# Patient Record
Sex: Male | Born: 1985 | Race: Black or African American | Hispanic: No | Marital: Married | Smoking: Current every day smoker
Health system: Southern US, Community
[De-identification: ages and names within clinical notes are randomized; demographics above are authoritative.]

---

## 2010-05-09 ENCOUNTER — Inpatient Hospital Stay (INDEPENDENT_AMBULATORY_CARE_PROVIDER_SITE_OTHER)
Admission: RE | Admit: 2010-05-09 | Discharge: 2010-05-09 | Disposition: A | Discharge status: Home / Self Care | Payer: Self-pay | Source: Ambulatory Visit | Attending: Emergency Medicine | Admitting: Emergency Medicine

## 2010-05-09 DIAGNOSIS — J020 Streptococcal pharyngitis: Secondary | ICD-10-CM

## 2011-01-12 ENCOUNTER — Inpatient Hospital Stay (INDEPENDENT_AMBULATORY_CARE_PROVIDER_SITE_OTHER)
Admission: RE | Admit: 2011-01-12 | Discharge: 2011-01-12 | Disposition: A | Discharge status: Home / Self Care | Payer: Self-pay | Source: Ambulatory Visit | Attending: Family Medicine | Admitting: Family Medicine

## 2011-01-12 DIAGNOSIS — H109 Unspecified conjunctivitis: Secondary | ICD-10-CM

## 2012-08-17 ENCOUNTER — Encounter (HOSPITAL_COMMUNITY): Payer: Self-pay | Admitting: Emergency Medicine

## 2012-08-17 ENCOUNTER — Emergency Department (HOSPITAL_COMMUNITY)
Admission: EM | Admit: 2012-08-17 | Discharge: 2012-08-18 | Disposition: A | Discharge status: Home / Self Care | Payer: Self-pay | Attending: Emergency Medicine | Admitting: Emergency Medicine

## 2012-08-17 DIAGNOSIS — K921 Melena: Secondary | ICD-10-CM | POA: Insufficient documentation

## 2012-08-17 DIAGNOSIS — F17200 Nicotine dependence, unspecified, uncomplicated: Secondary | ICD-10-CM | POA: Insufficient documentation

## 2012-08-17 DIAGNOSIS — K648 Other hemorrhoids: Secondary | ICD-10-CM | POA: Insufficient documentation

## 2012-08-17 NOTE — ED Notes (Signed)
Patient here for rectal bleeding.  Patient states that he has bleeding when he has a BM.  Patient states that he works for a [employer].

## 2012-08-18 MED ORDER — HYDROCORTISONE ACETATE 25 MG RE SUPP: 25.0000 mg | Freq: Two times a day (BID) | RECTAL | Status: DC

## 2012-08-18 NOTE — ED Provider Notes (Signed)
History     CSN: [id number]  Arrival date & time 08/17/12  2210   First MD Initiated Contact with Patient 08/18/12 0011      Chief Complaint  Patient presents with  . Rectal Bleeding    (Consider location/radiation/quality/duration/timing/severity/associated sxs/prior treatment) HPI Comments: Patient had 2 BM's tonight both with blood on tissue No Hx hemorrhoids, rectal pain , trauma recent illness/diarrhea  Both does have multiple BMs daily per routine  Denies lightheadedness, abdominal pain, dysuria  Patient is a 27 y.o. male presenting with hematochezia. The history is provided by the patient.  Rectal Bleeding Quality:  Bright red Timing:  Intermittent Chronicity:  New Context: defecation   Context: not constipation, not diarrhea, not rectal injury and not rectal pain   Similar prior episodes: no   Relieved by:  None tried Ineffective treatments:  None tried Associated symptoms: no abdominal pain, no dizziness and no fever   Risk factors: NSAID use     History reviewed. No pertinent past medical history.  History reviewed. No pertinent past surgical history.  History reviewed. No pertinent family history.  History  Substance Use Topics  . Smoking status: Current Every Day Smoker    Types: Cigarettes  . Smokeless tobacco: Not on file  . Alcohol Use: No     Comment: occa.      Review of Systems  Constitutional: Negative for fever and chills.  Gastrointestinal: Positive for blood in stool and hematochezia. Negative for nausea, abdominal pain, diarrhea, constipation, anal bleeding and rectal pain.  Skin: Negative for pallor.  Neurological: Negative for dizziness and weakness.  Hematological: Does not bruise/bleed easily.  All other systems reviewed and are negative.    Allergies  Review of patient's allergies indicates no known allergies.  Home Medications   Current Outpatient Rx  Name  Route  Sig  Dispense  Refill  . ibuprofen (ADVIL,MOTRIN) 200 MG  tablet   Oral   Take 200-1,000 mg by mouth every 6 (six) hours as needed for pain.         . hydrocortisone (ANUSOL-HC) 25 MG suppository   Rectal   Place 1 suppository (25 mg total) rectally 2 (two) times daily.   14 suppository   0     BP 130/78  Pulse 71  Temp(Src) 98.6 F (37 C) (Oral)  Resp 16  SpO2 98%  Physical Exam  Nursing note and vitals reviewed. Constitutional: He is oriented to person, place, and time. He appears well-developed and well-nourished. No distress.  Morbid obesity  HENT:  Head: Normocephalic.  Eyes: Pupils are equal, round, and reactive to light.  Cardiovascular: Normal rate and regular rhythm.   Pulmonary/Chest: Effort normal.  Abdominal: Soft. Bowel sounds are normal.  Genitourinary: Prostate normal. Rectal exam shows internal hemorrhoid. Rectal exam shows no external hemorrhoid. Prostate is not enlarged and not tender.  Neurological: He is alert and oriented to person, place, and time.  Skin: Skin is warm and dry. He is not diaphoretic.    ED Course  Procedures (including critical care time)  Labs Reviewed - No data to display No results found.   1. Internal hemorrhoid, bleeding       MDM   Will treat with Anusol suppositories for 7 days and GI FU if still havin gbleeding with BM        Arman Filter, NP 08/18/12 [phone]

## 2012-08-18 NOTE — ED Provider Notes (Signed)
Medical screening examination/treatment/procedure(s) were performed by non-physician practitioner and as supervising physician I was immediately available for consultation/collaboration.  Norman Bier R. Munachimso Rigdon, MD 08/18/12 0805 

## 2013-05-15 ENCOUNTER — Emergency Department (HOSPITAL_BASED_OUTPATIENT_CLINIC_OR_DEPARTMENT_OTHER)
Admission: EM | Admit: 2013-05-15 | Discharge: 2013-05-15 | Disposition: A | Discharge status: Home / Self Care | Payer: Self-pay | Attending: Emergency Medicine | Admitting: Emergency Medicine

## 2013-05-15 ENCOUNTER — Encounter (HOSPITAL_BASED_OUTPATIENT_CLINIC_OR_DEPARTMENT_OTHER): Payer: Self-pay | Admitting: Emergency Medicine

## 2013-05-15 DIAGNOSIS — IMO0002 Reserved for concepts with insufficient information to code with codable children: Secondary | ICD-10-CM | POA: Insufficient documentation

## 2013-05-15 DIAGNOSIS — F17200 Nicotine dependence, unspecified, uncomplicated: Secondary | ICD-10-CM | POA: Insufficient documentation

## 2013-05-15 DIAGNOSIS — J029 Acute pharyngitis, unspecified: Secondary | ICD-10-CM | POA: Insufficient documentation

## 2013-05-15 LAB — RAPID STREP SCREEN (MED CTR MEBANE ONLY): STREPTOCOCCUS, GROUP A SCREEN (DIRECT): NEGATIVE

## 2013-05-15 MED ORDER — IBUPROFEN 800 MG PO TABS
800.0000 mg | ORAL_TABLET | Freq: Once | ORAL | Status: AC
  Administered 2013-05-15: 800 mg via ORAL
  Filled 2013-05-15: qty 1

## 2013-05-15 NOTE — Discharge Instructions (Signed)
Take motrin 800 mg every 6 hrs for pain or fever.   Stay hydrated.   Follow up with your doctor.   Return to ER if you have severe pain, worse throat swelling, fever.

## 2013-05-15 NOTE — ED Notes (Signed)
Pt reports sore throat that started today, states he feels like his tonsils are swollen

## 2013-05-15 NOTE — ED Provider Notes (Signed)
CSN: [id number]     Arrival date & time 05/15/13  2036 History  This chart was scribed for Richardean Canal, MD by Dorothey Baseman, ED Scribe. This patient was seen in room MH09/MH09 and the patient's care was started at 9:15 PM.    Chief Complaint  Patient presents with  . Sore Throat   The history is provided by the patient. No language interpreter was used.   HPI Comments: David Rowland is a 28 y.o. male who presents to the Emergency Department complaining of a constant sore throat with associated anterior cervical lymphadenopathy and congestion onset earlier today. Patient denies taking any medications at home to treat his symptoms. He denies fever, cough. Patient has no other pertinent medical history.   History reviewed. No pertinent past medical history. History reviewed. No pertinent past surgical history. History reviewed. No pertinent family history. History  Substance Use Topics  . Smoking status: Current Every Day Smoker    Types: Cigarettes  . Smokeless tobacco: Not on file  . Alcohol Use: No     Comment: occa.    Review of Systems  Constitutional: Negative for fever.  HENT: Positive for congestion and sore throat.   Respiratory: Negative for cough.   Hematological: Positive for adenopathy.  All other systems reviewed and are negative.   Allergies  Review of patient's allergies indicates no known allergies.  Home Medications   Current Outpatient Rx  Name  Route  Sig  Dispense  Refill  . ibuprofen (ADVIL,MOTRIN) 200 MG tablet   Oral   Take 200-1,000 mg by mouth every 6 (six) hours as needed for pain.         . hydrocortisone (ANUSOL-HC) 25 MG suppository   Rectal   Place 1 suppository (25 mg total) rectally 2 (two) times daily.   14 suppository   0    Triage Vitals: BP 140/76  Pulse 93  Temp(Src) 98.7 F (37.1 C) (Oral)  Resp 18  Ht 6\' 3"  (1.905 m)  Wt 280 lb (127.007 kg)  BMI 35.00 kg/m2  SpO2 99%  Physical Exam  Nursing note and vitals  reviewed. Constitutional: He is oriented to person, place, and time. He appears well-developed and well-nourished. No distress.  HENT:  Head: Normocephalic and atraumatic.  Mouth/Throat: No oropharyngeal exudate.  Minimal pharyngeal erythema. 1+ enlarged tonsils bilaterally. No exudates.   Eyes: Conjunctivae are normal.  Neck: Normal range of motion. Neck supple.  Cardiovascular: Normal rate, regular rhythm and normal heart sounds.   Pulmonary/Chest: Effort normal and breath sounds normal. No respiratory distress.  Abdominal: Soft. Bowel sounds are normal. He exhibits no distension. There is no tenderness.  Musculoskeletal: Normal range of motion.  Lymphadenopathy:    He has no cervical adenopathy.  Neurological: He is alert and oriented to person, place, and time.  Skin: Skin is warm and dry.  Psychiatric: He has a normal mood and affect. His behavior is normal.    ED Course  Procedures (including critical care time)  DIAGNOSTIC STUDIES: Oxygen Saturation is 99% on room air, normal by my interpretation.    COORDINATION OF CARE: 9:17 PM- Ordered a rapid strep screen. Will order ibuprofen to manage symptoms. Discussed treatment plan with patient at bedside and patient verbalized agreement.     Labs Review Labs Reviewed  RAPID STREP SCREEN  CULTURE, GROUP A STREP   Imaging Review No results found.  EKG Interpretation   None       MDM   Final diagnoses:  None  David Rowland is a 28 y.o. male here with slightly swollen tonsils. Strep neg, well appearing. I think he likely has viral pharyngitis. Will d/c home with motrin prn.   I personally performed the services described in this documentation, which was scribed in my presence. The recorded information has been reviewed and is accurate.   Richardean Canalavid H Yao, MD 05/15/13 2127

## 2013-05-18 LAB — CULTURE, GROUP A STREP

## 2014-05-29 ENCOUNTER — Encounter (HOSPITAL_BASED_OUTPATIENT_CLINIC_OR_DEPARTMENT_OTHER): Payer: Self-pay | Admitting: *Deleted

## 2014-05-29 ENCOUNTER — Emergency Department (HOSPITAL_BASED_OUTPATIENT_CLINIC_OR_DEPARTMENT_OTHER)
Admission: EM | Admit: 2014-05-29 | Discharge: 2014-05-30 | Disposition: A | Discharge status: Home / Self Care | Payer: Self-pay | Attending: Emergency Medicine | Admitting: Emergency Medicine

## 2014-05-29 ENCOUNTER — Emergency Department (HOSPITAL_BASED_OUTPATIENT_CLINIC_OR_DEPARTMENT_OTHER): Payer: Self-pay

## 2014-05-29 DIAGNOSIS — Z720 Tobacco use: Secondary | ICD-10-CM | POA: Insufficient documentation

## 2014-05-29 DIAGNOSIS — R197 Diarrhea, unspecified: Secondary | ICD-10-CM | POA: Insufficient documentation

## 2014-05-29 DIAGNOSIS — J111 Influenza due to unidentified influenza virus with other respiratory manifestations: Secondary | ICD-10-CM | POA: Insufficient documentation

## 2014-05-29 DIAGNOSIS — M791 Myalgia: Secondary | ICD-10-CM | POA: Insufficient documentation

## 2014-05-29 DIAGNOSIS — Z7952 Long term (current) use of systemic steroids: Secondary | ICD-10-CM | POA: Insufficient documentation

## 2014-05-29 MED ORDER — OSELTAMIVIR PHOSPHATE 75 MG PO CAPS: 75.0000 mg | ORAL_CAPSULE | Freq: Two times a day (BID) | ORAL | Status: DC

## 2014-05-29 NOTE — ED Provider Notes (Signed)
CSN: [id number]     Arrival date & time 05/29/14  2127 History  This chart was scribed for Lorre Nick, MD by Luisa Dago, ED Scribe. This patient was seen in room MH10/MH10 and the patient's care was started at 11:32 PM.     Chief Complaint  Patient presents with  . Fever  . Cough    The history is provided by the patient and medical records. No language interpreter was used.   HPI Comments: David Rowland is a 29 y.o. male who presents to the Emergency Department complaining of an intermittent fever that started 24 hours ago. He is also complaining of associated cough, generalized myalgias, and lose bowel movements.  He endorses associated chest pain secondary to the forceful cough. Pt reports taking tylenol with minimal relief of his symptoms. He reports one sick contact. Denies any sore throat, nausea, emesis, or abdominal pain.  History reviewed. No pertinent past medical history. History reviewed. No pertinent past surgical history. No family history on file. History  Substance Use Topics  . Smoking status: Current Every Day Smoker    Types: Cigarettes  . Smokeless tobacco: Not on file  . Alcohol Use: No     Comment: occa.    Review of Systems  Constitutional: Positive for fever and chills.  Respiratory: Positive for cough.   Cardiovascular: Positive for chest pain.  Gastrointestinal: Positive for diarrhea. Negative for nausea, vomiting and abdominal pain.  Musculoskeletal: Positive for myalgias.  All other systems reviewed and are negative.     Allergies  Review of patient's allergies indicates no known allergies.  Home Medications   Prior to Admission medications   Medication Sig Start Date End Date Taking? Authorizing Provider  hydrocortisone (ANUSOL-HC) 25 MG suppository Place 1 suppository (25 mg total) rectally 2 (two) times daily. 08/18/12   Earley Favor, NP  ibuprofen (ADVIL,MOTRIN) 200 MG tablet Take 200-1,000 mg by mouth every 6 (six) hours as needed  for pain.    Historical Provider, MD   BP 130/72 mmHg  Pulse 109  Temp(Src) 100.3 F (37.9 C) (Oral)  Resp 20  Ht  (1.905 m)  Wt 300 lb (136.079 kg)  BMI 37.50 kg/m2  SpO2 96%  Physical Exam  Constitutional: He is oriented to person, place, and time. He appears well-developed and well-nourished.  Non-toxic appearance. No distress.  HENT:  Head: Normocephalic and atraumatic.  Eyes: Conjunctivae, EOM and lids are normal. Pupils are equal, round, and reactive to light.  Neck: Normal range of motion. Neck supple. No tracheal deviation present. No thyroid mass present.  Cardiovascular: Normal rate, regular rhythm and normal heart sounds.  Exam reveals no gallop.   No murmur heard. Pulmonary/Chest: Effort normal and breath sounds normal. No stridor. No respiratory distress. He has no decreased breath sounds. He has no wheezes. He has no rhonchi. He has no rales.  Abdominal: Soft. Normal appearance and bowel sounds are normal. He exhibits no distension. There is no tenderness. There is no rebound and no CVA tenderness.  Musculoskeletal: Normal range of motion. He exhibits no edema or tenderness.  Neurological: He is alert and oriented to person, place, and time. He has normal strength. No cranial nerve deficit or sensory deficit. GCS eye subscore is 4. GCS verbal subscore is 5. GCS motor subscore is 6.  Skin: Skin is warm and dry. No abrasion and no rash noted.  Psychiatric: He has a normal mood and affect. His speech is normal and behavior is normal.  Nursing note  and vitals reviewed.   ED Course  Procedures (including critical care time)  DIAGNOSTIC STUDIES: Oxygen Saturation is 96% on RA, adequate by my interpretation.    COORDINATION OF CARE: 11:35 PM- 1 day work note will be written per pt's request. Pt advised of plan for treatment and pt agrees.  Imaging Review Dg Chest 2 View  05/29/2014   CLINICAL DATA:  Cough, fever and body aches for 1 day.  EXAM: CHEST  2 VIEW   COMPARISON:  None.  FINDINGS: Cardiomediastinal silhouette is unremarkable. The lungs are clear without pleural effusions or focal consolidations. Trachea projects midline and there is no pneumothorax. Soft tissue planes and included osseous structures are non-suspicious.  IMPRESSION: Normal chest.   Electronically Signed   By: Awilda Metroourtnay  Bloomer   On: 05/29/2014 22:50     MDM   Final diagnoses:  None    I personally performed the services described in this documentation, which was scribed in my presence. The recorded information has been reviewed and is accurate.  Pt with likely flu, no meningeal signs, positive sick exposures, will tx with tamiflu   Lorre NickAnthony Aliene Tamura, MD 05/29/14 2357

## 2014-05-29 NOTE — Discharge Instructions (Signed)

## 2014-05-29 NOTE — ED Notes (Signed)
Fever, cough and body aches today.

## 2014-05-29 NOTE — ED Notes (Signed)
Fever, cough body aches x 24 hours

## 2016-05-19 ENCOUNTER — Emergency Department (HOSPITAL_BASED_OUTPATIENT_CLINIC_OR_DEPARTMENT_OTHER)
Admission: EM | Admit: 2016-05-19 | Discharge: 2016-05-19 | Disposition: A | Discharge status: Home / Self Care | Payer: 59 | Attending: Emergency Medicine | Admitting: Emergency Medicine

## 2016-05-19 ENCOUNTER — Emergency Department (HOSPITAL_BASED_OUTPATIENT_CLINIC_OR_DEPARTMENT_OTHER): Payer: 59

## 2016-05-19 ENCOUNTER — Encounter (HOSPITAL_BASED_OUTPATIENT_CLINIC_OR_DEPARTMENT_OTHER): Payer: Self-pay | Admitting: Emergency Medicine

## 2016-05-19 DIAGNOSIS — F17210 Nicotine dependence, cigarettes, uncomplicated: Secondary | ICD-10-CM | POA: Insufficient documentation

## 2016-05-19 DIAGNOSIS — R05 Cough: Secondary | ICD-10-CM | POA: Diagnosis present

## 2016-05-19 DIAGNOSIS — J069 Acute upper respiratory infection, unspecified: Secondary | ICD-10-CM

## 2016-05-19 MED ORDER — BENZONATATE 100 MG PO CAPS: 200.0000 mg | ORAL_CAPSULE | Freq: Two times a day (BID) | ORAL | 0 refills | Status: DC | PRN

## 2016-05-19 MED ORDER — CETIRIZINE HCL 10 MG PO TABS: 10.0000 mg | ORAL_TABLET | Freq: Every day | ORAL | 1 refills | Status: DC

## 2016-05-19 NOTE — ED Triage Notes (Signed)
Patient states that he has had a cough with intermittent mucous, and pain in chest with cough. The patient reports that generalized weakness and pain

## 2016-05-19 NOTE — ED Provider Notes (Signed)
[facility]   CSN: [id number] Arrival date & time: 05/19/16  1814  By signing my name below, I, Modena JanskyAlbert Thayil, attest that this documentation has been prepared under the direction and in the presence of non-physician practitioner, Roxy Horsemanobert Karcyn Menn, PA-C. Electronically Signed: Modena JanskyAlbert Thayil, Scribe. 05/19/2016. 6:45 PM.  History   Chief Complaint Chief Complaint  Patient presents with  . Cough   The history is provided by the patient. No language interpreter was used.   HPI Comments: David Rowland is a [age] y.o. male who presents to the Emergency Department complaining of intermittent moderate cough that started a few days ago. He describes the cough as productive. He took alka-seltzer with some relief. He reports associated chest pain (from cough). He admits to a hx of smoking. He denies any fever or other complaints.   History reviewed. No pertinent past medical history.  There are no active problems to display for this patient.   History reviewed. No pertinent surgical history.     Home Medications    Prior to Admission medications   Medication Sig Start Date End Date Taking? Authorizing Provider  hydrocortisone (ANUSOL-HC) 25 MG suppository Place 1 suppository (25 mg total) rectally 2 (two) times daily. 08/18/12   Earley FavorGail Schulz, NP  ibuprofen (ADVIL,MOTRIN) 200 MG tablet Take 200-1,000 mg by mouth every 6 (six) hours as needed for pain.    Historical Provider, MD  oseltamivir (TAMIFLU) 75 MG capsule Take 1 capsule (75 mg total) by mouth every 12 (twelve) hours. 05/29/14   Lorre NickAnthony Allen, MD    Family History History reviewed. No pertinent family history.  Social History Social History  Substance Use Topics  . Smoking status: Current Every Day Smoker    Types: Cigarettes  . Smokeless tobacco: Never Used  . Alcohol use No     Comment: occa.     Allergies   Patient has no known allergies.   Review of Systems Review of Systems    Constitutional: Negative for fever.  Respiratory: Positive for cough.   All other systems reviewed and are negative.    Physical Exam Updated Vital Signs BP 144/87   Pulse 84   Temp 98.7 F (37.1 C) (Oral)   Resp 22   Ht 6\' 3"  (1.905 m)   Wt 285 lb (129.3 kg)   SpO2 97%   BMI 35.62 kg/m   Physical Exam Nursing note and vitals reviewed.  Constitutional: Appears well-developed and well-nourished. No distress.  HENT: Oropharynx is mildly erythematous, no tonsillar exudates or abscess, airway intact. Eyes: Conjunctivae are normal. Pupils are equal, round, and reactive to light.  Neck: Normal range of motion and full passive range of motion without pain.  Cardiovascular: 84 (rate) and intact distal pulses.   Pulmonary/Chest: Effort normal and breath sounds normal. No stridor. No wheezes, rhonchi, or rales. Abdominal: Soft. There is no focal abdominal tenderness.  Musculoskeletal: Normal range of motion. Moves all extremities. Neurological: Pt is alert and oriented to person, place, and time. Sensation and strength grossly intact throughout. Skin: Skin is warm and dry. No rash noted. Pt is not diaphoretic.  Psychiatric: Normal mood and affect.    ED Treatments / Results  DIAGNOSTIC STUDIES: Oxygen Saturation is 97% on RA, normal by my interpretation.    COORDINATION OF CARE: 6:49 PM- Pt advised of plan for treatment and pt agrees.  Labs (all labs ordered are listed, but only abnormal results are displayed) Labs Reviewed - No data to display  EKG  EKG Interpretation None       Radiology No results found.  Procedures Procedures (including critical care time)  Medications Ordered in ED Medications - No data to display   Initial Impression / Assessment and Plan / ED Course  I have reviewed the triage vital signs and the nursing notes.  Pertinent labs & imaging results that were available during my care of the patient were reviewed by me and considered in my  medical decision making (see chart for details).     Pt CXR negative for acute infiltrate. Patients symptoms are consistent with URI, likely viral etiology. Discussed that antibiotics are not indicated for viral infections. Pt will be discharged with symptomatic treatment.  Verbalizes understanding and is agreeable with plan. Pt is hemodynamically stable & in NAD prior to dc.   Final Clinical Impressions(s) / ED Diagnoses   Final diagnoses:  Upper respiratory tract infection, unspecified type    New Prescriptions New Prescriptions   No medications on file   I personally performed the services described in this documentation, which was scribed in my presence. The recorded information has been reviewed and is accurate.       Roxy Horseman, PA-C 05/19/16 2013    Canary Brim Tegeler, MD 05/20/16 [phone]

## 2016-07-14 ENCOUNTER — Emergency Department (HOSPITAL_BASED_OUTPATIENT_CLINIC_OR_DEPARTMENT_OTHER): Payer: 59

## 2016-07-14 ENCOUNTER — Emergency Department (HOSPITAL_BASED_OUTPATIENT_CLINIC_OR_DEPARTMENT_OTHER)
Admission: EM | Admit: 2016-07-14 | Discharge: 2016-07-14 | Disposition: A | Discharge status: Home / Self Care | Payer: 59 | Attending: Emergency Medicine | Admitting: Emergency Medicine

## 2016-07-14 ENCOUNTER — Encounter (HOSPITAL_BASED_OUTPATIENT_CLINIC_OR_DEPARTMENT_OTHER): Payer: Self-pay | Admitting: *Deleted

## 2016-07-14 DIAGNOSIS — R110 Nausea: Secondary | ICD-10-CM | POA: Diagnosis not present

## 2016-07-14 DIAGNOSIS — R61 Generalized hyperhidrosis: Secondary | ICD-10-CM | POA: Insufficient documentation

## 2016-07-14 DIAGNOSIS — F17210 Nicotine dependence, cigarettes, uncomplicated: Secondary | ICD-10-CM | POA: Diagnosis not present

## 2016-07-14 DIAGNOSIS — R51 Headache: Secondary | ICD-10-CM | POA: Insufficient documentation

## 2016-07-14 DIAGNOSIS — R05 Cough: Secondary | ICD-10-CM | POA: Diagnosis present

## 2016-07-14 DIAGNOSIS — J111 Influenza due to unidentified influenza virus with other respiratory manifestations: Secondary | ICD-10-CM

## 2016-07-14 DIAGNOSIS — R6883 Chills (without fever): Secondary | ICD-10-CM | POA: Insufficient documentation

## 2016-07-14 DIAGNOSIS — Z791 Long term (current) use of non-steroidal anti-inflammatories (NSAID): Secondary | ICD-10-CM | POA: Insufficient documentation

## 2016-07-14 DIAGNOSIS — R69 Illness, unspecified: Secondary | ICD-10-CM

## 2016-07-14 DIAGNOSIS — R0981 Nasal congestion: Secondary | ICD-10-CM | POA: Diagnosis not present

## 2016-07-14 LAB — RAPID STREP SCREEN (MED CTR MEBANE ONLY): STREPTOCOCCUS, GROUP A SCREEN (DIRECT): NEGATIVE

## 2016-07-14 MED ORDER — ONDANSETRON 4 MG PO TBDP
4.0000 mg | ORAL_TABLET | Freq: Once | ORAL | Status: AC | PRN
  Administered 2016-07-14: 4 mg via ORAL
  Filled 2016-07-14: qty 1

## 2016-07-14 MED ORDER — OSELTAMIVIR PHOSPHATE 75 MG PO CAPS: 75.0000 mg | ORAL_CAPSULE | Freq: Two times a day (BID) | ORAL | 0 refills | Status: DC

## 2016-07-14 MED ORDER — OSELTAMIVIR PHOSPHATE 75 MG PO CAPS
75.0000 mg | ORAL_CAPSULE | Freq: Once | ORAL | Status: AC
  Administered 2016-07-14: 75 mg via ORAL
  Filled 2016-07-14: qty 1

## 2016-07-14 MED ORDER — ACETAMINOPHEN 325 MG PO TABS
650.0000 mg | ORAL_TABLET | Freq: Once | ORAL | Status: AC | PRN
  Administered 2016-07-14: 650 mg via ORAL
  Filled 2016-07-14: qty 2

## 2016-07-14 NOTE — Discharge Instructions (Signed)
Symptomatic treatment as we discussed recommend Mucinex DM for congestion and cough. Also recommend 500 mg of Naprosyn every 12 hours. Take the Tamiflu as directed. Work note provided. Also increase fluid intake. Return for any new or worse symptoms.

## 2016-07-14 NOTE — ED Triage Notes (Signed)
Pt reports URI, cough, generalized body aches x 2 days.  States that hes got hiccups that he cannot get rid of.

## 2016-07-14 NOTE — ED Provider Notes (Signed)
[facility]   CSN: [id number] Arrival date & time: 07/14/16  1814    By signing my name below, I, David Rowland, attest that this documentation has been prepared under the direction and in the presence of Vanetta Mulders, MD. Electronically Signed: Valentino Rowland, ED Scribe. 07/14/16. 9:03 PM.  History   Chief Complaint Chief Complaint  Patient presents with  . URI   The history is provided by the patient and the spouse. No language interpreter was used.   HPI Comments: David Rowland is a 31 y.o. male with no pertinent PMHx, who presents to the Emergency Department complaining of moderate, persistent, dry non-sputum cough onset two days ago. Pt reports associated 8/10, generalized body aches, chills, diaphoresis, accompanied by upper respiratory symptoms such as congestion, headache and nausea. Per pt, he notes having hiccups secondary to cough. Pt reports taking Theraflu and Tylenol at home with no relief. 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 Per spouse, she states pt received a flu shot earlier this year. Pt denies vomiting, rash, fever, visual changes, sore throat, chest pain, abdominal pain, diarrhea, dysuria, hematuria, ankle swelling.   History reviewed. No pertinent past medical history.  There are no active problems to display for this patient.   History reviewed. No pertinent surgical history.     Home Medications    Prior to Admission medications   Medication Sig Start Date End Date Taking? Authorizing Provider  oseltamivir (TAMIFLU) 75 MG capsule Take 1 capsule (75 mg total) by mouth every 12 (twelve) hours. 07/14/16   Vanetta Mulders, MD    Family History History reviewed. No pertinent family history.  Social History Social History  Substance Use Topics  . Smoking status: Current Every Day Smoker    Types: Cigarettes  . Smokeless tobacco: Never Used  . Alcohol use No     Comment: occa.      Allergies   Patient has no known allergies.   Review of Systems Review of Systems  Constitutional: Positive for chills and diaphoresis. Negative for fever.  HENT: Positive for congestion. Negative for sore throat.   Eyes: Negative for visual disturbance.  Respiratory: Positive for cough.   Cardiovascular: Negative for chest pain and leg swelling.  Gastrointestinal: Positive for nausea. Negative for abdominal pain, diarrhea and vomiting.  Genitourinary: Negative for dysuria and hematuria.  Musculoskeletal: Positive for myalgias (generalized).  Skin: Negative for rash.  Neurological: Positive for headaches.  Psychiatric/Behavioral: Negative for confusion.     Physical Exam Updated Vital Signs BP 127/68   Pulse 93   Temp 100.3 F (37.9 C) (Oral)   Resp 18   Ht  (1.905 m)   Wt 127 kg   SpO2 97%   BMI 35.00 kg/m   Physical Exam  Constitutional: He is oriented to person, place, and time. He appears well-developed and well-nourished.  HENT:  Head: Normocephalic and atraumatic.  Dry mucous membranes. Back of throat is normal.   Eyes: Conjunctivae are normal. Right eye exhibits no discharge. Left eye exhibits no discharge.  Pupils are normal. Sclera is clear. Eye track is normal.   Cardiovascular: Normal rate.   Pulmonary/Chest: Effort normal. No respiratory distress.  Lungs are clear bilaterally.   Abdominal: Bowel sounds are normal.  Bowel sounds normal. Belly is non-tender.   Musculoskeletal:  No ankle swelling.   Neurological: He is alert and oriented to person, place, and time. No cranial nerve deficit or sensory deficit. He exhibits normal muscle  tone. Coordination normal.  Skin: Skin is warm and dry. No rash noted. He is not diaphoretic. No erythema.  Psychiatric: He has a normal mood and affect.  Nursing note and vitals reviewed.    ED Treatments / Results   DIAGNOSTIC STUDIES: Oxygen Saturation is 97% on RA, normal by my interpretation.     COORDINATION OF CARE: 8:36 PM Discussed treatment plan with pt at bedside which includes labs, chest imaging, pain and nausea medication, and pt agreed to plan.   Labs (all labs ordered are listed, but only abnormal results are displayed) Labs Reviewed  RAPID STREP SCREEN (NOT AT [facility])  CULTURE, GROUP A STREP [facility])    EKG  EKG Interpretation None       Radiology Dg Chest 2 View  Result Date: 07/14/2016 CLINICAL DATA:  31 y/o  M; cough and chest pain. EXAM: CHEST  2 VIEW COMPARISON:  05/19/2016 chest radiograph FINDINGS: Stable heart size and mediastinal contours are within normal limits. Both lungs are clear. The visualized skeletal structures are unremarkable. IMPRESSION: No active cardiopulmonary disease. Electronically Signed   By: Mitzi Hansen M.D.   On: 07/14/2016 20:01    Procedures Procedures (including critical care time)  Medications Ordered in ED Medications  oseltamivir (TAMIFLU) capsule 75 mg (not administered)  acetaminophen (TYLENOL) tablet 650 mg (650 mg Oral Given 07/14/16 1942)  ondansetron (ZOFRAN-ODT) disintegrating tablet 4 mg (4 mg Oral Given 07/14/16 1942)     Initial Impression / Assessment and Plan / ED Course  I have reviewed the triage vital signs and the nursing notes.  Pertinent labs & imaging results that were available during my care of the patient were reviewed by me and considered in my medical decision making (see chart for details).     Patient symptoms consistent with a flulike illness. Patient nontoxic no acute distress. Rapid strep negative and chest x-ray negative for pneumonia. Will treat with Tamiflu since symptoms just started within the last 48 hours. Other treatment will be symptomatic. Patient will return for any new or worse symptoms.   Final Clinical Impressions(s) / ED Diagnoses   Final diagnoses:  Influenza-like illness    New Prescriptions New Prescriptions   OSELTAMIVIR (TAMIFLU) 75 MG CAPSULE    Take  1 capsule (75 mg total) by mouth every 12 (twelve) hours.    I personally performed the services described in this documentation, which was scribed in my presence. The recorded information has been reviewed and is accurate.       Vanetta Mulders, MD 07/14/16 2108

## 2016-07-17 LAB — CULTURE, GROUP A STREP (THRC)

## 2018-06-14 ENCOUNTER — Emergency Department (HOSPITAL_COMMUNITY)
Admission: EM | Admit: 2018-06-14 | Discharge: 2018-06-14 | Disposition: A | Discharge status: Home / Self Care | Payer: 59 | Attending: Emergency Medicine | Admitting: Emergency Medicine

## 2018-06-14 ENCOUNTER — Other Ambulatory Visit: Payer: Self-pay

## 2018-06-14 ENCOUNTER — Emergency Department (HOSPITAL_COMMUNITY): Payer: 59

## 2018-06-14 DIAGNOSIS — Y92410 Unspecified street and highway as the place of occurrence of the external cause: Secondary | ICD-10-CM | POA: Diagnosis not present

## 2018-06-14 DIAGNOSIS — F17210 Nicotine dependence, cigarettes, uncomplicated: Secondary | ICD-10-CM | POA: Insufficient documentation

## 2018-06-14 DIAGNOSIS — M79645 Pain in left finger(s): Secondary | ICD-10-CM | POA: Insufficient documentation

## 2018-06-14 DIAGNOSIS — Y939 Activity, unspecified: Secondary | ICD-10-CM | POA: Insufficient documentation

## 2018-06-14 DIAGNOSIS — Y998 Other external cause status: Secondary | ICD-10-CM | POA: Insufficient documentation
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
## 2018-06-14 NOTE — Progress Notes (Signed)
Orthopedic Tech Progress Note Patient Details:  David Rowland [birth year] [id number]  Ortho Devices Type of Ortho Device: Thumb velcro splint Ortho Device/Splint Location: lue Ortho Device/Splint Interventions: Ordered, Application, Adjustment   Post Interventions Patient Tolerated: Well Instructions Provided: Care of device, Adjustment of device   Trinna Post 06/14/2018, 1:59 PM
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
## 2019-04-03 ENCOUNTER — Emergency Department (HOSPITAL_COMMUNITY)
Admission: EM | Admit: 2019-04-03 | Discharge: 2019-04-03 | Disposition: A | Discharge status: Home / Self Care | Payer: Managed Care, Other (non HMO) | Attending: Emergency Medicine | Admitting: Emergency Medicine

## 2019-04-03 ENCOUNTER — Encounter (HOSPITAL_COMMUNITY): Payer: Self-pay | Admitting: Emergency Medicine

## 2019-04-03 ENCOUNTER — Other Ambulatory Visit: Payer: Self-pay

## 2019-04-03 DIAGNOSIS — R52 Pain, unspecified: Secondary | ICD-10-CM

## 2019-04-03 DIAGNOSIS — R519 Headache, unspecified: Secondary | ICD-10-CM | POA: Insufficient documentation

## 2019-04-03 DIAGNOSIS — F17210 Nicotine dependence, cigarettes, uncomplicated: Secondary | ICD-10-CM | POA: Diagnosis not present

## 2019-04-03 DIAGNOSIS — R0981 Nasal congestion: Secondary | ICD-10-CM | POA: Diagnosis not present

## 2019-04-03 DIAGNOSIS — M791 Myalgia, unspecified site: Secondary | ICD-10-CM | POA: Insufficient documentation

## 2019-04-03 DIAGNOSIS — Z20822 Contact with and (suspected) exposure to covid-19: Secondary | ICD-10-CM | POA: Diagnosis not present

## 2019-04-03 MED ORDER — NAPROXEN 500 MG PO TABS: 500.0000 mg | ORAL_TABLET | Freq: Two times a day (BID) | ORAL | 0 refills | Status: AC

## 2019-04-03 MED ORDER — BENZONATATE 100 MG PO CAPS: 100.0000 mg | ORAL_CAPSULE | Freq: Three times a day (TID) | ORAL | 0 refills | Status: AC | PRN

## 2019-04-03 MED ORDER — FLUTICASONE PROPIONATE 50 MCG/ACT NA SUSP: 1.0000 | Freq: Every day | NASAL | 0 refills | Status: AC

## 2019-04-03 NOTE — ED Triage Notes (Signed)
C/o body aches and headache since yesterday.

## 2019-04-03 NOTE — Discharge Instructions (Addendum)
We have tested you for COVID 19, we will call you within the next 72 hours if results are positive, you may also view these results on MyChart.   We are instructing patient's with COVID 19 or symptoms of COVID 19 to quarantine themselves for 14 days. You may be able to discontinue self quarantine if the following conditions are met:   Persons with COVID-19 who have symptoms and were directed to care for themselves at home may discontinue home isolation under the  following conditions: - It has been at least 7 days have passed since symptoms first appeared. - AND at least 3 days (72 hours) have passed since recovery defined as resolution of fever without the use of fever-reducing medications and improvement in respiratory symptoms (e.g., cough, shortness of breath)  Please follow the below quarantine instructions.  We are sending you home with the following medicines to help with your symptoms:  - Naproxen is a nonsteroidal anti-inflammatory medication that will help with pain and swelling. Be sure to take this medication as prescribed with food, 1 pill every 12 hours,  It should be taken with food, as it can cause stomach upset, and more seriously, stomach bleeding. Do not take other nonsteroidal anti-inflammatory medications with this such as Advil, Motrin, Aleve, Mobic, Goodie Powder, or Motrin.    - Flonase- this is a decongestant-please use 1 spray per nostril daily  -Tessalon- this is a cough medicine- use 1 tablet every 8 hours as needed for coughing  You make take Tylenol per over the counter dosing with these medications.   We have prescribed you new medication(s) today. Discuss the medications prescribed today with your pharmacist as they can have adverse effects and interactions with your other medicines including over the counter and prescribed medications. Seek medical evaluation if you start to experience new or abnormal symptoms after taking one of these medicines, seek care  immediately if you start to experience difficulty breathing, feeling of your throat closing, facial swelling, or rash as these could be indications of a more serious allergic reaction  Please try to stop smoking  Please follow up with primary care within 3-5 days for re-evaluation- call prior to going to the office to make them aware of your symptoms as some offices are altering their method of seeing patients with COVID 19 symptoms. Return to the ER for new or worsening symptoms including but not limited to increased work of breathing, chest pain, passing out, or any other concerns.       Person Under Monitoring Name: David Rowland  Location: [place] [address]   Infection Prevention Recommendations for Individuals Confirmed to have, or Being Evaluated for, 2019 Novel Coronavirus (COVID-19) Infection Who Receive Care at Home  Individuals who are confirmed to have, or are being evaluated for, COVID-19 should follow the prevention steps below until a healthcare provider or local or state health department says they can return to normal activities.  Stay home except to get medical care You should restrict activities outside your home, except for getting medical care. Do not go to work, school, or public areas, and do not use public transportation or taxis.  Call ahead before visiting your doctor Before your medical appointment, call the healthcare provider and tell them that you have, or are being evaluated for, COVID-19 infection. This will help the healthcare provider's office take steps to keep other people from getting infected. Ask your healthcare provider to call the local or state health department.  Monitor your symptoms Seek prompt medical attention if your illness is worsening (e.g., difficulty breathing). Before going to your medical appointment, call the healthcare provider and tell them that you have, or are being evaluated for, COVID-19  infection. Ask your healthcare provider to call the local or state health department.  Wear a facemask You should wear a facemask that covers your nose and mouth when you are in the same room with other people and when you visit a healthcare provider. People who live with or visit you should also wear a facemask while they are in the same room with you.  Separate yourself from other people in your home As much as possible, you should stay in a different room from other people in your home. Also, you should use a separate bathroom, if available.  Avoid sharing household items You should not share dishes, drinking glasses, cups, eating utensils, towels, bedding, or other items with other people in your home. After using these items, you should wash them thoroughly with soap and water.  Cover your coughs and sneezes Cover your mouth and nose with a tissue when you cough or sneeze, or you can cough or sneeze into your sleeve. Throw used tissues in a lined trash can, and immediately wash your hands with soap and water for at least 20 seconds or use an alcohol-based hand rub.  Wash your [facility] your hands often and thoroughly with soap and water for at least 20 seconds. You can use an alcohol-based hand sanitizer if soap and water are not available and if your hands are not visibly dirty. Avoid touching your eyes, nose, and mouth with unwashed hands.   Prevention Steps for Caregivers and Household Members of Individuals Confirmed to have, or Being Evaluated for, COVID-19 Infection Being Cared for in the Home  If you live with, or provide care at home for, a person confirmed to have, or being evaluated for, COVID-19 infection please follow these guidelines to prevent infection:  Follow healthcare provider's instructions Make sure that you understand and can help the patient follow any healthcare provider instructions for all care.  Provide for the patient's basic needs You should  help the patient with basic needs in the home and provide support for getting groceries, prescriptions, and other personal needs.  Monitor the patient's symptoms If they are getting sicker, call his or her medical provider and tell them that the patient has, or is being evaluated for, COVID-19 infection. This will help the healthcare provider's office take steps to keep other people from getting infected. Ask the healthcare provider to call the local or state health department.  Limit the number of people who have contact with the patient If possible, have only one caregiver for the patient. Other household members should stay in another home or place of residence. If this is not possible, they should stay in another room, or be separated from the patient as much as possible. Use a separate bathroom, if available. Restrict visitors who do not have an essential need to be in the home.  Keep older adults, very young children, and other sick people away from the patient Keep older adults, very young children, and those who have compromised immune systems or chronic health conditions away from the patient. This includes people with chronic heart, lung, or kidney conditions, diabetes, and cancer.  Ensure good ventilation Make sure that shared spaces in the home have good air flow, such as from an air conditioner or an opened window,  weather permitting.  Wash your hands often Wash your hands often and thoroughly with soap and water for at least 20 seconds. You can use an alcohol based hand sanitizer if soap and water are not available and if your hands are not visibly dirty. Avoid touching your eyes, nose, and mouth with unwashed hands. Use disposable paper towels to dry your hands. If not available, use dedicated cloth towels and replace them when they become wet.  Wear a facemask and gloves Wear a disposable facemask at all times in the room and gloves when you touch or have contact with the  patient's blood, body fluids, and/or secretions or excretions, such as sweat, saliva, sputum, nasal mucus, vomit, urine, or feces.  Ensure the mask fits over your nose and mouth tightly, and do not touch it during use. Throw out disposable facemasks and gloves after using them. Do not reuse. Wash your hands immediately after removing your facemask and gloves. If your personal clothing becomes contaminated, carefully remove clothing and launder. Wash your hands after handling contaminated clothing. Place all used disposable facemasks, gloves, and other waste in a lined container before disposing them with other household waste. Remove gloves and wash your hands immediately after handling these items.  Do not share dishes, glasses, or other household items with the patient Avoid sharing household items. You should not share dishes, drinking glasses, cups, eating utensils, towels, bedding, or other items with a patient who is confirmed to have, or being evaluated for, COVID-19 infection. After the person uses these items, you should wash them thoroughly with soap and water.  Wash laundry thoroughly Immediately remove and wash clothes or bedding that have blood, body fluids, and/or secretions or excretions, such as sweat, saliva, sputum, nasal mucus, vomit, urine, or feces, on them. Wear gloves when handling laundry from the patient. Read and follow directions on labels of laundry or clothing items and detergent. In general, wash and dry with the warmest temperatures recommended on the label.  Clean all areas the individual has used often Clean all touchable surfaces, such as counters, tabletops, doorknobs, bathroom fixtures, toilets, phones, keyboards, tablets, and bedside tables, every day. Also, clean any surfaces that may have blood, body fluids, and/or secretions or excretions on them. Wear gloves when cleaning surfaces the patient has come in contact with. Use a diluted bleach solution (e.g.,  dilute bleach with 1 part bleach and 10 parts water) or a household disinfectant with a label that says EPA-registered for coronaviruses. To make a bleach solution at home, add 1 tablespoon of bleach to 1 quart (4 cups) of water. For a larger supply, add  cup of bleach to 1 gallon (16 cups) of water. Read labels of cleaning products and follow recommendations provided on product labels. Labels contain instructions for safe and effective use of the cleaning product including precautions you should take when applying the product, such as wearing gloves or eye protection and making sure you have good ventilation during use of the product. Remove gloves and wash hands immediately after cleaning.  Monitor yourself for signs and symptoms of illness Caregivers and household members are considered close contacts, should monitor their health, and will be asked to limit movement outside of the home to the extent possible. Follow the monitoring steps for close contacts listed on the symptom monitoring form.   ? If you have additional questions, contact your local health department or call the epidemiologist on call at [phone] (available 24/7). ? This guidance is subject to change.  For the most up-to-date guidance from [facility], please refer to their website: [email or web address]

## 2019-04-03 NOTE — ED Provider Notes (Signed)
[facility] EMERGENCY DEPARTMENT Provider Note   CSN: [id number] Arrival date & time: 04/03/19  0755     History Chief Complaint  Patient presents with  . ? COVID  . Generalized Body Aches  . Headache    David Rowland is a 34 y.o. male with a hx of tobacco use who presents to the ED with complaints of not feeling well for past 2-3 days. Patient reports sxs include nasal congestion, body aches, headache (gradual onset, steady progression, similar to prior), and fatigue. States minimal cough. No alleviating/aggravating factors. No intervention PTA. Denies fever, visual disturbance, numbness, weakness, dizziness, syncope, head injury, N/V/D, abdominal pain, dyspnea, chest pain, or neck stiffness. No known definitive covid exposures, does work with the public. He is concerned that these could be sxs of covid.   HPI     History reviewed. No pertinent past medical history.  There are no problems to display for this patient.   History reviewed. No pertinent surgical history.     History reviewed. No pertinent family history.  Social History   Tobacco Use  . Smoking status: Current Every Day Smoker    Types: Cigarettes  . Smokeless tobacco: Never Used  Substance Use Topics  . Alcohol use: No    Comment: occa.  . Drug use: Yes    Types: Marijuana    Home Medications Prior to Admission medications   Not on File    Allergies    Patient has no known allergies.  Review of Systems   Review of Systems  Constitutional: Negative for fever.  HENT: Positive for congestion. Negative for ear pain and sore throat.   Respiratory: Positive for cough (minimal). Negative for shortness of breath.   Cardiovascular: Negative for chest pain and leg swelling.  Gastrointestinal: Negative for abdominal pain, blood in stool, diarrhea, nausea and vomiting.  Genitourinary: Negative for dysuria.  Musculoskeletal: Positive for myalgias (generalized). Negative for neck  stiffness.  Neurological: Positive for headaches. Negative for dizziness, seizures, syncope, speech difficulty, weakness, light-headedness and numbness.    Physical Exam Updated Vital Signs BP (!) 134/93 (BP Location: Left Arm)   Pulse 65   Temp 98 F (36.7 C) (Oral)   Resp 18   SpO2 96%   Physical Exam Vitals and nursing note reviewed.  Constitutional:      General: He is not in acute distress.    Appearance: Normal appearance. He is well-developed. He is not toxic-appearing.  HENT:     Head: Normocephalic and atraumatic.     Right Ear: Ear canal normal. Tympanic membrane is not perforated, erythematous, retracted or bulging.     Left Ear: Ear canal normal. Tympanic membrane is not perforated, erythematous, retracted or bulging.     Ears:     Comments: No mastoid erythema/swellng/tenderness.     Nose:     Right Sinus: No maxillary sinus tenderness or frontal sinus tenderness.     Left Sinus: No maxillary sinus tenderness or frontal sinus tenderness.     Mouth/Throat:     Pharynx: Oropharynx is clear. Uvula midline. No oropharyngeal exudate or posterior oropharyngeal erythema.     Comments: Posterior oropharynx is symmetric appearing. Patient tolerating own secretions without difficulty. No trismus. No drooling. No hot potato voice. No swelling beneath the tongue, submandibular compartment is soft.  Eyes:     General: Vision grossly intact. Gaze aligned appropriately.        Right eye: No discharge.        Left  eye: No discharge.     Extraocular Movements: Extraocular movements intact.     Conjunctiva/sclera: Conjunctivae normal.     Pupils: Pupils are equal, round, and reactive to light.     Comments: No proptosis.   Cardiovascular:     Rate and Rhythm: Normal rate and regular rhythm.  Pulmonary:     Effort: Pulmonary effort is normal. No respiratory distress.     Breath sounds: Normal breath sounds. No wheezing, rhonchi or rales.  Abdominal:     General: There is no  distension.     Palpations: Abdomen is soft.     Tenderness: There is no abdominal tenderness. There is no guarding or rebound.  Musculoskeletal:     Cervical back: Normal range of motion and neck supple. No rigidity.  Lymphadenopathy:     Cervical: No cervical adenopathy.  Skin:    General: Skin is warm and dry.     Findings: No rash.  Neurological:     Mental Status: He is alert.     Comments: Alert. Clear speech. No facial droop. CNIII-XII grossly intact. Bilateral upper and lower extremities' sensation grossly intact. 5/5 symmetric strength with grip strength and with plantar and dorsi flexion bilaterally . Normal finger to nose bilaterally. Negative pronator drift. Gait intact.    Psychiatric:        Mood and Affect: Mood normal.        Behavior: Behavior normal.    ED Results / Procedures / Treatments   Labs (all labs ordered are listed, but only abnormal results are displayed) Labs Reviewed  NOVEL CORONAVIRUS, NAA (HOSP ORDER, SEND-OUT TO REF LAB; TAT 18-24 HRS)    EKG None  Radiology No results found.  Procedures Procedures (including critical care time)  Medications Ordered in ED Medications - No data to display  ED Course  I have reviewed the triage vital signs and the nursing notes.  Pertinent labs & imaging results that were available during my care of the patient were reviewed by me and considered in my medical decision making (see chart for details).    David Rowland was evaluated in Emergency Department on 04/03/2019 for the symptoms described in the history of present illness. He/she was evaluated in the context of the global COVID-19 pandemic, which necessitated consideration that the patient might be at risk for infection with the SARS-CoV-2 virus that causes COVID-19. Institutional protocols and algorithms that pertain to the evaluation of patients at risk for COVID-19 are in a state of rapid change based on information released by regulatory bodies  including the CDC and federal and state organizations. These policies and algorithms were followed during the patient's care in the ED.  MDM Rules/Calculators/A&P                      Patient presents to the ED with complaints of body aches.  Nontoxic, no apparent distress, vitals WNL with the exception of elevated BP- doubt HTN emergency. Afebrile, no sinus tenderness, sxs < 10 days- doubt acute bacterial sinusitis. No AOM/AOE/mastoiditis on exam. Centor 0- doubt strep. No meningismus. No respiratory distress, lungs CTA- doubt acute bacterial pneumonia. No neuro deficits or headache red flags. Suspect viral, sxs > 48 hours do not feel tamiflu would be beneficial- have also not seen much influenza to this point. COVID test sent. Discussed quarantine. Supportive care prescribed.Tobacco cessation counseling performed. I discussed treatment plan, need for follow-up, and return precautions with the patient. Provided opportunity for questions, patient confirmed  understanding and is in agreement with plan.   Final Clinical Impression(s) / ED Diagnoses Final diagnoses:  Body aches    Rx / DC Orders ED Discharge Orders         Ordered    fluticasone (FLONASE) 50 MCG/ACT nasal spray  Daily     04/03/19 0827    benzonatate (TESSALON) 100 MG capsule  3 times daily PRN     04/03/19 0827    naproxen (NAPROSYN) 500 MG tablet  2 times daily     04/03/19 0827           Niv Darley, Glynda Jaeger, PA-C 04/03/19 0830    Maudie Flakes, MD 04/07/19 [phone]

## 2019-04-04 LAB — NOVEL CORONAVIRUS, NAA (HOSP ORDER, SEND-OUT TO REF LAB; TAT 18-24 HRS): SARS-CoV-2, NAA: NOT DETECTED

## 2019-07-08 ENCOUNTER — Other Ambulatory Visit: Payer: Self-pay

## 2019-07-08 ENCOUNTER — Encounter (HOSPITAL_COMMUNITY): Payer: Self-pay

## 2019-07-08 ENCOUNTER — Ambulatory Visit (HOSPITAL_COMMUNITY)
Admission: EM | Admit: 2019-07-08 | Discharge: 2019-07-08 | Disposition: A | Discharge status: Home / Self Care | Payer: Managed Care, Other (non HMO) | Attending: Family Medicine | Admitting: Family Medicine

## 2019-07-08 DIAGNOSIS — R112 Nausea with vomiting, unspecified: Secondary | ICD-10-CM | POA: Insufficient documentation

## 2019-07-08 DIAGNOSIS — F17210 Nicotine dependence, cigarettes, uncomplicated: Secondary | ICD-10-CM | POA: Insufficient documentation

## 2019-07-08 DIAGNOSIS — Z20822 Contact with and (suspected) exposure to covid-19: Secondary | ICD-10-CM | POA: Diagnosis not present

## 2019-07-08 DIAGNOSIS — R197 Diarrhea, unspecified: Secondary | ICD-10-CM

## 2019-07-08 NOTE — ED Provider Notes (Signed)
[facility]    CSN: [id number] Arrival date & time: 07/08/19  1318      History   Chief Complaint Chief Complaint  Patient presents with  . Headache  . Emesis  . Generalized Body Aches  . Diarrhea    HPI David Rowland is a 34 y.o. male.   Pt is a 34 year old male presents today for nausea, vomiting, diarrhea.  Symptoms started a few hours after eating a meal yesterday.  Had some stomach upset and diarrhea last night and then woke up this morning and vomited.  He has not vomited since this morning.  He has been sipping fluids.  3-4 episodes of semisolid diarrhea.  No blood in vomit or stool.  No fever but has had hot flashes and sweats.  No upper respiratory symptoms, chest pain or shortness of breath.  No recent sick contacts  ROS per HPI      History reviewed. No pertinent past medical history.  There are no problems to display for this patient.   History reviewed. No pertinent surgical history.     Home Medications    Prior to Admission medications   Medication Sig Start Date End Date Taking? Authorizing Provider  benzonatate (TESSALON) 100 MG capsule Take 1 capsule (100 mg total) by mouth 3 (three) times daily as needed for cough. 04/03/19   Petrucelli, Samantha R, PA-C  fluticasone (FLONASE) 50 MCG/ACT nasal spray Place 1 spray into both nostrils daily. 04/03/19   Petrucelli, Samantha R, PA-C  naproxen (NAPROSYN) 500 MG tablet Take 1 tablet (500 mg total) by mouth 2 (two) times daily. 04/03/19   Petrucelli, Pleas Koch, PA-C    Family History History reviewed. No pertinent family history.  Social History Social History   Tobacco Use  . Smoking status: Current Every Day Smoker    Types: Cigarettes  . Smokeless tobacco: Never Used  Substance Use Topics  . Alcohol use: No    Comment: occa.  . Drug use: Yes    Types: Marijuana     Allergies   Patient has no known allergies.   Review of Systems Review of Systems   Physical  Exam Triage Vital Signs ED Triage Vitals  Enc Vitals Group     BP 07/08/19 1413 132/85     Pulse Rate 07/08/19 1413 73     Resp 07/08/19 1413 19     Temp 07/08/19 1413 98.4 F (36.9 C)     Temp Source 07/08/19 1413 Oral     SpO2 07/08/19 1413 98 %     Weight --      Height --      Head Circumference --      Peak Flow --      Pain Score 07/08/19 1410 4     Pain Loc --      Pain Edu? --      Excl. in GC? --    No data found.  Updated Vital Signs BP 132/85 (BP Location: Right Arm)   Pulse 73   Temp 98.4 F (36.9 C) (Oral)   Resp 19   SpO2 98%   Visual Acuity Right Eye Distance:   Left Eye Distance:   Bilateral Distance:    Right Eye Near:   Left Eye Near:    Bilateral Near:     Physical Exam Vitals and nursing note reviewed.  Constitutional:      Appearance: Normal appearance.  HENT:     Head: Normocephalic and atraumatic.  Nose: Nose normal.  Eyes:     Conjunctiva/sclera: Conjunctivae normal.  Pulmonary:     Effort: Pulmonary effort is normal.  Musculoskeletal:        General: Normal range of motion.     Cervical back: Normal range of motion.  Skin:    General: Skin is warm and dry.  Neurological:     Mental Status: He is alert.  Psychiatric:        Mood and Affect: Mood normal.      UC Treatments / Results  Labs (all labs ordered are listed, but only abnormal results are displayed) Labs Reviewed  SARS CORONAVIRUS 2 (TAT 6-24 HRS)    EKG   Radiology No results found.  Procedures Procedures (including critical care time)  Medications Ordered in UC Medications - No data to display  Initial Impression / Assessment and Plan / UC Course  I have reviewed the triage vital signs and the nursing notes.  Pertinent labs & imaging results that were available during my care of the patient were reviewed by me and considered in my medical decision making (see chart for details).     N,V,D This is most likely a stomach virus or food related.  Symptoms see to be improving.  Recommended staying hydrated with fluids that Gatorade and water. Advance diet as tolerated. Follow up as needed for continued or worsening symptoms  Final Clinical Impressions(s) / UC Diagnoses   Final diagnoses:  Nausea vomiting and diarrhea     Discharge Instructions     This is most likely because of something you ate or stomach virus.  Make sure that you stay hydrated with fluids like Gatorade and water.  Advance diet as tolerated  Follow up as needed for continued or worsening symptoms     ED Prescriptions    None     PDMP not reviewed this encounter.   Loura Halt A, NP 07/08/19 1513

## 2019-07-08 NOTE — Discharge Instructions (Addendum)
This is most likely because of something you ate or stomach virus.  Make sure that you stay hydrated with fluids like Gatorade and water.  Advance diet as tolerated  Follow up as needed for continued or worsening symptoms

## 2019-07-08 NOTE — ED Triage Notes (Signed)
Pt presents to UC with headache, nausea, body aches and diarrhea x 1 day.

## 2019-07-09 LAB — SARS CORONAVIRUS 2 (TAT 6-24 HRS): SARS Coronavirus 2: NEGATIVE

## 2019-08-04 ENCOUNTER — Ambulatory Visit: Payer: Managed Care, Other (non HMO) | Attending: Internal Medicine

## 2019-08-04 DIAGNOSIS — Z23 Encounter for immunization: Secondary | ICD-10-CM

## 2019-08-04 NOTE — Progress Notes (Signed)
   Covid-19 Vaccination Clinic  Name:  Azazel Franze    MRN: [id number] DOB: 1985-09-13  08/04/2019  Mr. Danzer was observed post Covid-19 immunization for 15 minutes without incident. He was provided with Vaccine Information Sheet and instruction to access the V-Safe system.   Mr. Leard was instructed to call 911 with any severe reactions post vaccine: Marland Kitchen Difficulty breathing  . Swelling of face and throat  . A fast heartbeat  . A bad rash all over body  . Dizziness and weakness   Immunizations Administered    Name Date Dose VIS Date Route   Pfizer COVID-19 Vaccine 08/04/2019  8:56 AM 0.3 mL 05/14/2018 Intramuscular   Manufacturer: [employer], [employer]   Lot: [id number]   NDC: [address]-1

## 2019-08-25 ENCOUNTER — Ambulatory Visit: Payer: Managed Care, Other (non HMO)

## 2019-09-15 ENCOUNTER — Ambulatory Visit: Payer: Managed Care, Other (non HMO) | Attending: Internal Medicine

## 2019-09-15 DIAGNOSIS — Z23 Encounter for immunization: Secondary | ICD-10-CM

## 2019-09-15 NOTE — Progress Notes (Signed)
   Covid-19 Vaccination Clinic  Name:  David Rowland    MRN: [id number] DOB: 1985/06/27  09/15/2019  [name] was observed post Covid-19 immunization for 15 minutes without incident. He was provided with Vaccine Information Sheet and instruction to access the V-Safe system.   [name] was instructed to call 911 with any severe reactions post vaccine: Marland Kitchen Difficulty breathing  . Swelling of face and throat  . A fast heartbeat  . A bad rash all over body  . Dizziness and weakness   Immunizations Administered    Name Date Dose VIS Date Route   Pfizer COVID-19 Vaccine 09/15/2019  9:13 AM 0.3 mL 05/14/2018 Intramuscular   Manufacturer: [employer], [employer]   Lot: [id number]   NDC: [address]-8

## 2019-09-25 ENCOUNTER — Encounter (HOSPITAL_COMMUNITY): Payer: Self-pay

## 2019-09-25 ENCOUNTER — Other Ambulatory Visit: Payer: Self-pay

## 2019-09-25 ENCOUNTER — Ambulatory Visit (HOSPITAL_COMMUNITY)
Admission: EM | Admit: 2019-09-25 | Discharge: 2019-09-25 | Disposition: A | Discharge status: Home / Self Care | Payer: Managed Care, Other (non HMO) | Attending: Physician Assistant | Admitting: Physician Assistant

## 2019-09-25 DIAGNOSIS — K529 Noninfective gastroenteritis and colitis, unspecified: Secondary | ICD-10-CM | POA: Diagnosis not present

## 2019-09-25 MED ORDER — ONDANSETRON HCL 4 MG PO TABS: 4.0000 mg | ORAL_TABLET | Freq: Three times a day (TID) | ORAL | 0 refills | Status: AC | PRN

## 2019-09-25 MED ORDER — LOPERAMIDE HCL 2 MG PO CAPS: 2.0000 mg | ORAL_CAPSULE | ORAL | 0 refills | Status: AC | PRN

## 2019-09-25 NOTE — Discharge Instructions (Addendum)
Use the zofran as needed for nausea  Only start immodium if persistent diarrhea through this evening, if severe belly pain stop immodium and return - if fever, chills or worsening please return or go to the ED

## 2019-09-25 NOTE — ED Triage Notes (Signed)
PT c/o 6/10 sharp LUQ pain in abdomen and diarrhea started at 022 today. Pt states had 10 bouts of diarrhea. Pt denies any other symptoms

## 2019-09-25 NOTE — ED Provider Notes (Signed)
[facility]    CSN: [id number] Arrival date & time: 09/25/19  1245      History   Chief Complaint Chief Complaint  Patient presents with  . Abdominal Pain    HPI David Rowland is a 34 y.o. male.   Patient reports for left sided belly pain and diarrhea. Reports this started after eating wendys yesterday. Reports son has similar symptoms. Reports 1 episode of vomiting, but has tolerated fluids since then. Several episodes of loose stool. No black color or blood. No fever, chills. Reports pain is primary left upper quadrant.  He has been drinking sugar-free Gatorade and water without issue.     History reviewed. No pertinent past medical history.  There are no problems to display for this patient.   History reviewed. No pertinent surgical history.     Home Medications    Prior to Admission medications   Medication Sig Start Date End Date Taking? Authorizing Provider  benzonatate (TESSALON) 100 MG capsule Take 1 capsule (100 mg total) by mouth 3 (three) times daily as needed for cough. 04/03/19   Petrucelli, Samantha R, PA-C  fluticasone (FLONASE) 50 MCG/ACT nasal spray Place 1 spray into both nostrils daily. 04/03/19   Petrucelli, Samantha R, PA-C  loperamide (IMODIUM) 2 MG capsule Take 1 capsule (2 mg total) by mouth as needed for diarrhea or loose stools. 09/25/19   Mads Borgmeyer, Veryl Speak, PA-C  naproxen (NAPROSYN) 500 MG tablet Take 1 tablet (500 mg total) by mouth 2 (two) times daily. 04/03/19   Petrucelli, Samantha R, PA-C  ondansetron (ZOFRAN) 4 MG tablet Take 1 tablet (4 mg total) by mouth every 8 (eight) hours as needed for nausea or vomiting. 09/25/19   Gennesis Hogland, Veryl Speak, PA-C    Family History No family history on file.  Social History Social History   Tobacco Use  . Smoking status: Current Every Day Smoker    Packs/day: 0.50    Types: Cigarettes  . Smokeless tobacco: Never Used  Substance Use Topics  . Alcohol use: Yes    Comment: occa.  . Drug use: Yes     Types: Marijuana     Allergies   Patient has no known allergies.   Review of Systems Review of Systems   Physical Exam Triage Vital Signs ED Triage Vitals [09/25/19 1352]  Enc Vitals Group     BP 97/66     Pulse Rate 70     Resp 16     Temp 98.1 F (36.7 C)     Temp Source Oral     SpO2 99 %     Weight (!) 310 lb (140.6 kg)     Height 6\' 3"  (1.905 m)     Head Circumference      Peak Flow      Pain Score 6     Pain Loc      Pain Edu?      Excl. in GC?    No data found.  Updated Vital Signs BP 97/66   Pulse 70   Temp 98.1 F (36.7 C) (Oral)   Resp 16   Ht 6\' 3"  (1.905 m)   Wt (!) 310 lb (140.6 kg)   SpO2 99%   BMI 38.75 kg/m   Visual Acuity Right Eye Distance:   Left Eye Distance:   Bilateral Distance:    Right Eye Near:   Left Eye Near:    Bilateral Near:     Physical Exam Vitals and nursing note reviewed.  Constitutional:      General: He is not in acute distress.    Appearance: He is well-developed. He is not ill-appearing.  HENT:     Head: Normocephalic and atraumatic.  Eyes:     Conjunctiva/sclera: Conjunctivae normal.  Cardiovascular:     Rate and Rhythm: Normal rate and regular rhythm.     Heart sounds: No murmur heard.   Pulmonary:     Effort: Pulmonary effort is normal. No respiratory distress.     Breath sounds: Normal breath sounds.  Abdominal:     General: Abdomen is protuberant. Bowel sounds are normal. There is no distension.     Palpations: Abdomen is soft.     Tenderness: There is generalized abdominal tenderness and tenderness in the epigastric area and left upper quadrant.  Musculoskeletal:     Cervical back: Neck supple.  Skin:    General: Skin is warm and dry.  Neurological:     Mental Status: He is alert.      UC Treatments / Results  Labs (all labs ordered are listed, but only abnormal results are displayed) Labs Reviewed - No data to display  EKG   Radiology No results found.  Procedures Procedures  (including critical care time)  Medications Ordered in UC Medications - No data to display  Initial Impression / Assessment and Plan / UC Course  I have reviewed the triage vital signs and the nursing notes.  Pertinent labs & imaging results that were available during my care of the patient were reviewed by me and considered in my medical decision making (see chart for details).     #Gastroenteritis Patient is a 34 year old presenting with symptoms consistent with a viral gastroenteritis.  He is well-appearing here in clinic today with normal vital signs.  Zofran sent.  Discussed use of Imodium.  Encouraged p.o. liquids.  Strict return and emergency precautions were discussed.  Patient verbalized understanding plan of care. Final Clinical Impressions(s) / UC Diagnoses   Final diagnoses:  Gastroenteritis     Discharge Instructions     Use the zofran as needed for nausea  Only start immodium if persistent diarrhea through this evening, if severe belly pain stop immodium and return - if fever, chills or worsening please return or go to the ED      ED Prescriptions    Medication Sig Dispense Auth. Provider   ondansetron (ZOFRAN) 4 MG tablet Take 1 tablet (4 mg total) by mouth every 8 (eight) hours as needed for nausea or vomiting. 4 tablet Ajeet Casasola, Veryl Speak, PA-C   loperamide (IMODIUM) 2 MG capsule Take 1 capsule (2 mg total) by mouth as needed for diarrhea or loose stools. 12 capsule Ajax Schroll, Veryl Speak, PA-C     PDMP not reviewed this encounter.   Hermelinda Medicus, PA-C 09/25/19 2314

## 2020-05-08 ENCOUNTER — Encounter (HOSPITAL_BASED_OUTPATIENT_CLINIC_OR_DEPARTMENT_OTHER): Payer: Self-pay | Admitting: Emergency Medicine

## 2020-05-08 ENCOUNTER — Emergency Department (HOSPITAL_BASED_OUTPATIENT_CLINIC_OR_DEPARTMENT_OTHER): Payer: Managed Care, Other (non HMO)

## 2020-05-08 ENCOUNTER — Emergency Department (HOSPITAL_BASED_OUTPATIENT_CLINIC_OR_DEPARTMENT_OTHER)
Admission: EM | Admit: 2020-05-08 | Discharge: 2020-05-08 | Disposition: A | Discharge status: Home / Self Care | Payer: Managed Care, Other (non HMO) | Attending: Emergency Medicine | Admitting: Emergency Medicine

## 2020-05-08 ENCOUNTER — Other Ambulatory Visit: Payer: Self-pay

## 2020-05-08 DIAGNOSIS — R42 Dizziness and giddiness: Secondary | ICD-10-CM | POA: Diagnosis not present

## 2020-05-08 DIAGNOSIS — E860 Dehydration: Secondary | ICD-10-CM | POA: Insufficient documentation

## 2020-05-08 DIAGNOSIS — R0602 Shortness of breath: Secondary | ICD-10-CM | POA: Diagnosis not present

## 2020-05-08 DIAGNOSIS — R197 Diarrhea, unspecified: Secondary | ICD-10-CM | POA: Insufficient documentation

## 2020-05-08 DIAGNOSIS — R110 Nausea: Secondary | ICD-10-CM | POA: Diagnosis not present

## 2020-05-08 DIAGNOSIS — F17210 Nicotine dependence, cigarettes, uncomplicated: Secondary | ICD-10-CM | POA: Diagnosis not present

## 2020-05-08 DIAGNOSIS — Z20822 Contact with and (suspected) exposure to covid-19: Secondary | ICD-10-CM | POA: Diagnosis not present

## 2020-05-08 LAB — COMPREHENSIVE METABOLIC PANEL
ALT: 28 U/L (ref 0–44)
AST: 25 U/L (ref 15–41)
Albumin: 4.8 g/dL (ref 3.5–5.0)
Alkaline Phosphatase: 50 U/L (ref 38–126)
Anion gap: 11 (ref 5–15)
BUN: 18 mg/dL (ref 6–20)
CO2: 24 mmol/L (ref 22–32)
Calcium: 9.5 mg/dL (ref 8.9–10.3)
Chloride: 104 mmol/L (ref 98–111)
Creatinine, Ser: 1.22 mg/dL (ref 0.61–1.24)
GFR, Estimated: >60 mL/min (ref >60)
Glucose, Bld: 104 mg/dL — ABNORMAL HIGH (ref 70–99)
Potassium: 4.4 mmol/L (ref 3.5–5.1)
Sodium: 139 mmol/L (ref 135–145)
Total Bilirubin: 0.6 mg/dL (ref 0.3–1.2)
Total Protein: 7.9 g/dL (ref 6.5–8.1)

## 2020-05-08 LAB — CBC WITH DIFFERENTIAL/PLATELET
Abs Immature Granulocytes: 0.01 10*3/uL (ref 0.00–0.07)
Basophils Absolute: 0 10*3/uL (ref 0.0–0.1)
Basophils Relative: 0 %
Eosinophils Absolute: 0.1 10*3/uL (ref 0.0–0.5)
Eosinophils Relative: 1 %
HCT: 45.1 % (ref 39.0–52.0)
Hemoglobin: 14.8 g/dL (ref 13.0–17.0)
Immature Granulocytes: 0 %
Lymphocytes Relative: 15 %
Lymphs Abs: 0.8 10*3/uL (ref 0.7–4.0)
MCH: 27.8 pg (ref 26.0–34.0)
MCHC: 32.8 g/dL (ref 30.0–36.0)
MCV: 84.8 fL (ref 80.0–100.0)
Monocytes Absolute: 0.4 10*3/uL (ref 0.1–1.0)
Monocytes Relative: 7 %
Neutro Abs: 4 10*3/uL (ref 1.7–7.7)
Neutrophils Relative %: 77 %
Platelets: 271 10*3/uL (ref 150–400)
RBC: 5.32 MIL/uL (ref 4.22–5.81)
RDW: 14.5 % (ref 11.5–15.5)
WBC: 5.2 10*3/uL (ref 4.0–10.5)
nRBC: 0 % (ref 0.0–0.2)

## 2020-05-08 LAB — LIPASE, BLOOD: Lipase: 35 U/L (ref 11–51)

## 2020-05-08 LAB — SARS CORONAVIRUS 2 (TAT 6-24 HRS): SARS Coronavirus 2: NEGATIVE

## 2020-05-08 MED ORDER — ONDANSETRON 4 MG PO TBDP: 4.0000 mg | ORAL_TABLET | Freq: Three times a day (TID) | ORAL | 0 refills | Status: DC | PRN

## 2020-05-08 MED ORDER — ONDANSETRON HCL 4 MG/2ML IJ SOLN
4.0000 mg | Freq: Once | INTRAMUSCULAR | Status: AC
  Administered 2020-05-08: 4 mg via INTRAVENOUS
  Filled 2020-05-08: qty 2

## 2020-05-08 MED ORDER — SODIUM CHLORIDE 0.9 % IV BOLUS
1000.0000 mL | Freq: Once | INTRAVENOUS | Status: AC
  Administered 2020-05-08: 1000 mL via INTRAVENOUS

## 2020-05-08 NOTE — ED Triage Notes (Signed)
Pt reports woke up at 4:30 this morning not feeling well. Pt reports dizziness, nausea, and diarrhea. Pt denies exposure to others with illness.

## 2020-05-08 NOTE — ED Provider Notes (Signed)
Emergency Department Provider Note   I have reviewed the triage vital signs and the nursing notes.   HISTORY  Chief Complaint Dizziness   HPI David Rowland is a 35 y.o. male with past medical history reviewed below presents to the emergency department for evaluation of acute onset diarrhea with nausea and lightheadedness. Patient woke up with symptoms starting abruptly at 4:30 AM this morning. He states on the way to the bathroom became very nauseated and felt short of breath. His shortness of breath and lightheadedness have continued throughout the morning without chest pain. He is not having heart palpitations or diaphoresis. He states he went to bed last night feeling well. He denies any known sick contacts. He has had his full COVID vaccine series and booster. Denies any cough, congestion, sore throat. No radiation of symptoms or other modifying factors.  History reviewed. No pertinent past medical history.  There are no problems to display for this patient.   History reviewed. No pertinent surgical history.  Allergies Patient has no known allergies.  No family history on file.  Social History Social History   Tobacco Use  . Smoking status: Current Every Day Smoker    Packs/day: 0.50    Types: Cigarettes  . Smokeless tobacco: Never Used  Vaping Use  . Vaping Use: Never used  Substance Use Topics  . Alcohol use: Yes    Comment: occa.  . Drug use: Yes    Types: Marijuana    Review of Systems  Constitutional: No fever/chills. Positive generalized weakness and dizziness.  Eyes: No visual changes. ENT: No sore throat. Cardiovascular: Denies chest pain. Respiratory: Positive shortness of breath. Gastrointestinal: No abdominal pain. Positive nausea, no vomiting. Positive diarrhea.  No constipation. Genitourinary: Negative for dysuria. Musculoskeletal: Negative for back pain. Skin: Negative for rash. Neurological: Negative for headaches, focal weakness or  numbness.  10-point ROS otherwise negative.  ____________________________________________   PHYSICAL EXAM:  VITAL SIGNS: ED Triage Vitals  Enc Vitals Group     BP 05/08/20 0731 (!) 152/93     Pulse Rate 05/08/20 0731 82     Resp 05/08/20 0731 18     Temp 05/08/20 0731 99.4 F (37.4 C)     Temp Source 05/08/20 0731 Oral     SpO2 05/08/20 0731 98 %     Weight 05/08/20 0733 275 lb (124.7 kg)     Height 05/08/20 0733 6\' 3"  (1.905 m)   Constitutional: Alert and oriented. Well appearing and in no acute distress. Eyes: Conjunctivae are normal.  Head: Atraumatic. Nose: No congestion/rhinnorhea. Mouth/Throat: Mucous membranes are moist.   Neck: No stridor.   Cardiovascular: Normal rate, regular rhythm. Good peripheral circulation. Grossly normal heart sounds.   Respiratory: Normal respiratory effort.  No retractions. Lungs CTAB. Gastrointestinal: Soft and nontender. No distention.  Musculoskeletal: No lower extremity tenderness nor edema. No gross deformities of extremities. Neurologic:  Normal speech and language. No gross focal neurologic deficits are appreciated.  Skin:  Skin is warm, dry and intact. No rash noted.  ____________________________________________   LABS (all labs ordered are listed, but only abnormal results are displayed)  Labs Reviewed  COMPREHENSIVE METABOLIC PANEL - Abnormal; Notable for the following components:      Result Value   Glucose, Bld 104 (*)    All other components within normal limits  SARS CORONAVIRUS 2 (TAT 6-24 HRS)  LIPASE, BLOOD  CBC WITH DIFFERENTIAL/PLATELET   ____________________________________________  EKG   EKG Interpretation  Date/Time:  Saturday May 08 2020 07:46:56 EST Ventricular Rate:  77 PR Interval:    QRS Duration: 99 QT Interval:  368 QTC Calculation: 417 R Axis:   91 Text Interpretation: Sinus rhythm Consider right ventricular hypertrophy no old tracing for comparison Confirmed by Alona Bene [phone]) on  05/08/2020 8:33:53 AM       ____________________________________________  RADIOLOGY  DG Chest Portable 1 View  Result Date: 05/08/2020 CLINICAL DATA:  Shortness of breath. EXAM: PORTABLE CHEST 1 VIEW COMPARISON:  07/14/2016 FINDINGS: The heart size and mediastinal contours are within normal limits. Both lungs are clear. The visualized skeletal structures are unremarkable. IMPRESSION: No active disease. Electronically Signed   By: Signa Kell M.D.   On: 05/08/2020 09:36    ____________________________________________   PROCEDURES  Procedure(s) performed:   Procedures  None  ____________________________________________   INITIAL IMPRESSION / ASSESSMENT AND PLAN / ED COURSE  Pertinent labs & imaging results that were available during my care of the patient were reviewed by me and considered in my medical decision making (see chart for details).   Patient presents to the emergency department with acute onset nausea with diarrhea, lightheadedness. He describes some shortness of breath this morning but no chest pain. Suspicion overall for PE/ACS is exceedingly low. We will perform a screening EKG and chest x-ray. Plan for labs including hepatic function and lipase. Patient has no focal tenderness on abdominal exam to suspect acute cholecystitis, diverticulitis, appendicitis although these diagnoses were considered. Plan for IV fluids and Zofran. Will perform Covid PCR testing which will come back in the next 24 hours. Discussed the MyChart app with the patient and will provide information at discharge on how to set this up on his phone to follow the test results.  Patient's lab work coming back looking reassuring. CXR with no active disease. Patient feeling improved after IVF. Abdominal exam remains benign. Low risk for c. Diff. No indication for emergent imaging of the abdomen. Plan for supportive care meds at home, COVID testing with quarantine, and close PCP follow up in the coming  week. Patient to f/u the COVID test results in the MyChart app on his phone. Information provided at discharge regarding this app and sign up process.  ____________________________________________  FINAL CLINICAL IMPRESSION(S) / ED DIAGNOSES  Final diagnoses:  Lightheadedness  Dehydration  Diarrhea, unspecified type     MEDICATIONS GIVEN DURING THIS VISIT:  Medications  sodium chloride 0.9 % bolus 1,000 mL (0 mLs Intravenous Stopped 05/08/20 0955)  ondansetron (ZOFRAN) injection 4 mg (4 mg Intravenous Given 05/08/20 0809)     NEW OUTPATIENT MEDICATIONS STARTED DURING THIS VISIT:  Discharge Medication List as of 05/08/2020  9:39 AM    START taking these medications   Details  ondansetron (ZOFRAN ODT) 4 MG disintegrating tablet Take 1 tablet (4 mg total) by mouth every 8 (eight) hours as needed., Starting Sat 05/08/2020, Normal        Note:  This document was prepared using Dragon voice recognition software and may include unintentional dictation errors.  Alona Bene, MD, [facility]    Long, Arlyss Repress, MD 05/08/20 [phone]

## 2020-05-08 NOTE — Discharge Instructions (Signed)
As we discussed, we believe her symptoms are caused today by mild volume depletion, or mild dehydration, without any evidence of damage to your body.  Please drink plenty of clear fluids such as water and/or Gatorade and follow up with your regular doctor or the doctors listed in his documentation at the next available opportunity.  Return to the emergency department with any new or worsening symptoms that concern you, including but not limited to fever, shortness of breath, chest pain, or other concerning symptoms.  We are testing you for COVID-19.  The test results will be available in the MyChart app in the next 1 to 2 days.  Please remain in quarantine until your test results come back and they are negative.

## 2021-08-04 IMAGING — DX DG CHEST 1V PORT
1 series · 1 of 1 positions shown · non-contrast
Comparison: 07/14/2016

CLINICAL DATA: Shortness of breath.

EXAM:
PORTABLE CHEST 1 VIEW

[chest ap]
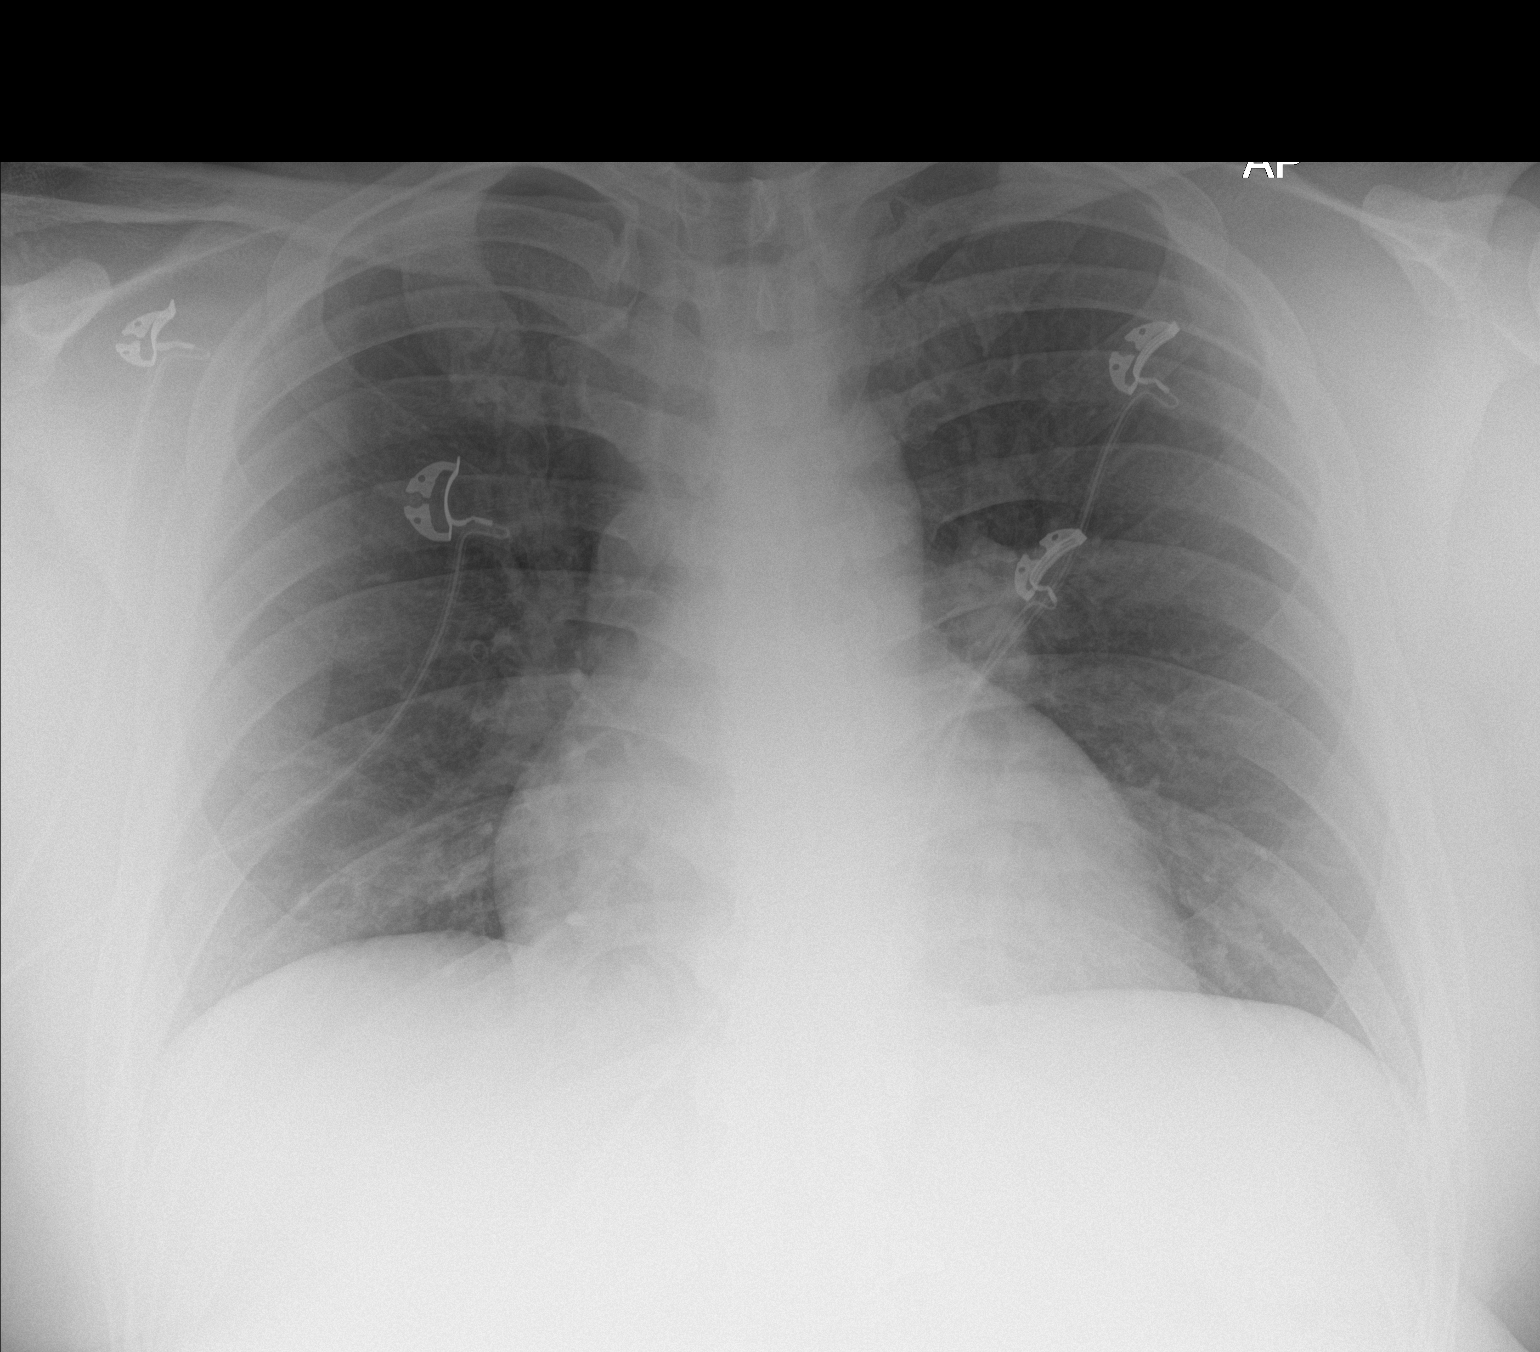

[1 of 1 positions shown; findings below may reference images not displayed]

FINDINGS: The heart size and mediastinal contours are within normal limits.
Both lungs are clear. The visualized skeletal structures are
unremarkable.
IMPRESSION: No active disease.

## 2021-12-29 ENCOUNTER — Emergency Department (HOSPITAL_COMMUNITY)
Admission: EM | Admit: 2021-12-29 | Discharge: 2021-12-29 | Disposition: A | Discharge status: Home / Self Care | Payer: Managed Care, Other (non HMO) | Attending: Emergency Medicine | Admitting: Emergency Medicine

## 2021-12-29 ENCOUNTER — Other Ambulatory Visit: Payer: Self-pay

## 2021-12-29 ENCOUNTER — Emergency Department (HOSPITAL_COMMUNITY): Payer: Managed Care, Other (non HMO)

## 2021-12-29 DIAGNOSIS — N200 Calculus of kidney: Secondary | ICD-10-CM

## 2021-12-29 DIAGNOSIS — N132 Hydronephrosis with renal and ureteral calculous obstruction: Secondary | ICD-10-CM | POA: Insufficient documentation

## 2021-12-29 DIAGNOSIS — R1031 Right lower quadrant pain: Secondary | ICD-10-CM | POA: Diagnosis present

## 2021-12-29 LAB — URINALYSIS, ROUTINE W REFLEX MICROSCOPIC
Bilirubin Urine: NEGATIVE
Glucose, UA: NEGATIVE mg/dL
Hgb urine dipstick: NEGATIVE
Ketones, ur: NEGATIVE mg/dL
Leukocytes,Ua: NEGATIVE
Nitrite: NEGATIVE
Protein, ur: NEGATIVE mg/dL
Specific Gravity, Urine: 1.023 (ref 1.005–1.030)
pH: 6 (ref 5.0–8.0)

## 2021-12-29 LAB — COMPREHENSIVE METABOLIC PANEL
ALT: 35 U/L (ref 0–44)
AST: 32 U/L (ref 15–41)
Albumin: 4.9 g/dL (ref 3.5–5.0)
Alkaline Phosphatase: 52 U/L (ref 38–126)
Anion gap: 12 (ref 5–15)
BUN: 18 mg/dL (ref 6–20)
CO2: 21 mmol/L — ABNORMAL LOW (ref 22–32)
Calcium: 10.2 mg/dL (ref 8.9–10.3)
Chloride: 107 mmol/L (ref 98–111)
Creatinine, Ser: 1.48 mg/dL — ABNORMAL HIGH (ref 0.61–1.24)
GFR, Estimated: >60 mL/min (ref >60)
Glucose, Bld: 106 mg/dL — ABNORMAL HIGH (ref 70–99)
Potassium: 3.8 mmol/L (ref 3.5–5.1)
Sodium: 140 mmol/L (ref 135–145)
Total Bilirubin: 0.4 mg/dL (ref 0.3–1.2)
Total Protein: 7.6 g/dL (ref 6.5–8.1)

## 2021-12-29 LAB — CBC WITH DIFFERENTIAL/PLATELET
Abs Immature Granulocytes: 0.03 10*3/uL (ref 0.00–0.07)
Basophils Absolute: 0 10*3/uL (ref 0.0–0.1)
Basophils Relative: 0 %
Eosinophils Absolute: 0.1 10*3/uL (ref 0.0–0.5)
Eosinophils Relative: 1 %
HCT: 42.1 % (ref 39.0–52.0)
Hemoglobin: 13.7 g/dL (ref 13.0–17.0)
Immature Granulocytes: 0 %
Lymphocytes Relative: 30 %
Lymphs Abs: 2.8 10*3/uL (ref 0.7–4.0)
MCH: 28 pg (ref 26.0–34.0)
MCHC: 32.5 g/dL (ref 30.0–36.0)
MCV: 85.9 fL (ref 80.0–100.0)
Monocytes Absolute: 0.7 10*3/uL (ref 0.1–1.0)
Monocytes Relative: 8 %
Neutro Abs: 5.5 10*3/uL (ref 1.7–7.7)
Neutrophils Relative %: 61 %
Platelets: 290 10*3/uL (ref 150–400)
RBC: 4.9 MIL/uL (ref 4.22–5.81)
RDW: 14.6 % (ref 11.5–15.5)
WBC: 9.1 10*3/uL (ref 4.0–10.5)
nRBC: 0 % (ref 0.0–0.2)

## 2021-12-29 LAB — LIPASE, BLOOD: Lipase: 38 U/L (ref 11–51)

## 2021-12-29 MED ORDER — FENTANYL CITRATE PF 50 MCG/ML IJ SOSY
50.0000 ug | PREFILLED_SYRINGE | Freq: Once | INTRAMUSCULAR | Status: AC
  Administered 2021-12-29: 50 ug via INTRAVENOUS
  Filled 2021-12-29: qty 1

## 2021-12-29 MED ORDER — OXYCODONE-ACETAMINOPHEN 5-325 MG PO TABS
1.0000 | ORAL_TABLET | Freq: Once | ORAL | Status: AC
  Administered 2021-12-29: 1 via ORAL
  Filled 2021-12-29: qty 1

## 2021-12-29 MED ORDER — IOHEXOL 350 MG/ML SOLN
75.0000 mL | Freq: Once | INTRAVENOUS | Status: AC | PRN
  Administered 2021-12-29: 75 mL via INTRAVENOUS

## 2021-12-29 MED ORDER — ONDANSETRON HCL 4 MG/2ML IJ SOLN
4.0000 mg | Freq: Once | INTRAMUSCULAR | Status: AC
  Administered 2021-12-29: 4 mg via INTRAVENOUS
  Filled 2021-12-29: qty 2

## 2021-12-29 NOTE — ED Notes (Signed)
Pt verbalized understanding of d/c instructions, meds, and followup care. Denies questions. VSS, no distress noted. Steady gait to exit with all belongings.  ?

## 2021-12-29 NOTE — ED Triage Notes (Signed)
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 Pain radiates to from on abdomen into groin. Tried to have BM- became nauseated, diaphoretic, and dizzy. Seems to come and go in waves. Reports no urinary symptoms. Reports no changes to eating habits or recent illness.

## 2021-12-29 NOTE — ED Notes (Signed)
Pt unable to void at this time. 

## 2021-12-29 NOTE — ED Provider Triage Note (Addendum)
Emergency Medicine Provider Triage Evaluation Note  David Rowland , a 36 y.o. male  was evaluated in triage.  Pt complains of sudden onset right-sided abdominal pain.  Was at work today, had just finished eating, and noticed sudden onset of acute pain.  Described as strong, waxing and waning, and 10/10.  Patient appears very uncomfortable and is walking around pacing throughout the room.  Denies urinary symptoms.  Stated he tried to have a bowel movement and was unable to, though BM this morning was normal.  Denies fever, chills, though endorses nausea without vomiting.  Denies testicular or penile pain, swelling, or other symptoms.  Chronic tobacco use.  Review of Systems  Positive:  Negative: See above  Physical Exam  BP 136/79 (BP Location: Left Arm)   Pulse 75   Temp 98.6 F (37 C)   Resp 16   SpO2 100%  Gen:   Awake, ill-appearing, diaphoretic, pacing around the room, appears very uncomfortable Resp:  Normal effort  MSK:   Moves extremities without difficulty  Other:  Abdomen soft, protuberant, with exquisite tenderness.  Pain appears out of proportion on exam in the right upper and lower quadrants.  Without pelvic or suprapubic tenderness.  Medical Decision Making  Medically screening exam initiated at 4:04 PM.  Appropriate orders placed.  Kailash Hinze was informed that the remainder of the evaluation will be completed by another provider, this initial triage assessment does not replace that evaluation, and the importance of remaining in the ED until their evaluation is complete.     Prince Rome, PA-C 61/60/73 1607  Discussed patient with triage nurse, concern for acute abdominal emergency.  Zofran and fentanyl ordered.  Patient to be brought back to one of the next available rooms.   Prince Rome, PA-C 71/06/26 1608

## 2021-12-29 NOTE — ED Provider Notes (Signed)
[place] EMERGENCY DEPARTMENT Provider Note   CSN: [id number] Arrival date & time: 12/29/21  1451     History  Chief Complaint  Patient presents with   Abdominal Pain    David Rowland is a 36 y.o. male.  Patient is a 36 year old male with no significant medical history presenting today with sudden onset of right-sided abdominal pain.  Patient reports he was fine when he arrived at work today and it was not until after lunch when he suddenly started getting a pain in his right side.  He thought he needed to use the restroom however in the restroom the pain became more severe it caused him to break out into a sweat and have severe nausea.  He had to lay down and was trying to stretch out his abdomen and the pain seemed to improve a little bit.  He then walked back out into his work area when the pain came back and cause the same symptoms.  After about 20 minutes he felt like it was getting better so went back up on the floor to start working when the pain came back even more severe and caused him to drop to all fours.  When he arrived in the emergency room the pain was very severe.  He has waited approximately 4 hours and he reports he was given some medication earlier and now his pain is almost completely gone.  He denies any fever, diarrhea, penile pain or burning.  Denies ever having symptoms like this before.  The history is provided by the patient.  Abdominal Pain      Home Medications Prior to Admission medications   Medication Sig Start Date End Date Taking? Authorizing Provider  benzonatate (TESSALON) 100 MG capsule Take 1 capsule (100 mg total) by mouth 3 (three) times daily as needed for cough. 04/03/19   Petrucelli, Samantha R, PA-C  fluticasone (FLONASE) 50 MCG/ACT nasal spray Place 1 spray into both nostrils daily. 04/03/19   Petrucelli, Samantha R, PA-C  loperamide (IMODIUM) 2 MG capsule Take 1 capsule (2 mg total) by mouth as needed for diarrhea or  loose stools. 09/25/19   Darr, Edison Nasuti, PA-C  naproxen (NAPROSYN) 500 MG tablet Take 1 tablet (500 mg total) by mouth 2 (two) times daily. 04/03/19   Petrucelli, Samantha R, PA-C  naproxen sodium (ALEVE) 220 MG tablet Take 220 mg by mouth.    [provider]  ondansetron (ZOFRAN ODT) 4 MG disintegrating tablet Take 1 tablet (4 mg total) by mouth every 8 (eight) hours as needed. 05/08/20   Long, Wonda Olds, MD  ondansetron (ZOFRAN) 4 MG tablet Take 1 tablet (4 mg total) by mouth every 8 (eight) hours as needed for nausea or vomiting. 09/25/19   Darr, Edison Nasuti, PA-C      Allergies    Patient has no known allergies.    Review of Systems   Review of Systems  Gastrointestinal:  Positive for abdominal pain.    Physical Exam Updated Vital Signs BP 135/88 (BP Location: Right Arm)   Pulse 70   Temp 98.5 F (36.9 C) (Oral)   Resp 15   SpO2 100%  Physical Exam Vitals and nursing note reviewed.  Constitutional:      General: He is not in acute distress.    Appearance: He is well-developed.  HENT:     Head: Normocephalic and atraumatic.  Eyes:     Conjunctiva/sclera: Conjunctivae normal.     Pupils: Pupils are equal, round, and reactive  to light.  Cardiovascular:     Rate and Rhythm: Normal rate and regular rhythm.     Heart sounds: No murmur heard. Pulmonary:     Effort: Pulmonary effort is normal. No respiratory distress.     Breath sounds: Normal breath sounds. No wheezing or rales.  Abdominal:     General: There is no distension.     Palpations: Abdomen is soft.     Tenderness: There is no abdominal tenderness. There is no guarding or rebound.     Comments: Minimal CVA tenderness  Musculoskeletal:        General: No tenderness. Normal range of motion.     Cervical back: Normal range of motion and neck supple.  Skin:    General: Skin is warm and dry.     Findings: No erythema or rash.  Neurological:     Mental Status: He is alert and oriented to person, place, and time.   Psychiatric:        Behavior: Behavior normal.     ED Results / Procedures / Treatments   Labs (all labs ordered are listed, but only abnormal results are displayed) Labs Reviewed  COMPREHENSIVE METABOLIC PANEL - Abnormal; Notable for the following components:      Result Value   CO2 21 (*)    Glucose, Bld 106 (*)    Creatinine, Ser 1.48 (*)    All other components within normal limits  CBC WITH DIFFERENTIAL/PLATELET  LIPASE, BLOOD  URINALYSIS, ROUTINE W REFLEX MICROSCOPIC    EKG None  Radiology CT Abdomen Pelvis W Contrast  Result Date: 12/29/2021 CLINICAL DATA:  Flank and right lower quadrant pain EXAM: CT ABDOMEN AND PELVIS WITH CONTRAST TECHNIQUE: Multidetector CT imaging of the abdomen and pelvis was performed using the standard protocol following bolus administration of intravenous contrast. RADIATION DOSE REDUCTION: This exam was performed according to the departmental dose-optimization program which includes automated exposure control, adjustment of the mA and/or kV according to patient size and/or use of iterative reconstruction technique. CONTRAST:  47mL OMNIPAQUE IOHEXOL 350 MG/ML SOLN COMPARISON:  None Available. FINDINGS: Lower chest: Lung bases demonstrate no acute airspace disease. Hepatobiliary: Hepatic steatosis. No calcified gallstone or biliary dilatation Pancreas: Unremarkable. No pancreatic ductal dilatation or surrounding inflammatory changes. Spleen: Normal in size without focal abnormality. Adrenals/Urinary Tract: Right adrenal gland is normal. 2.7 cm left adrenal nodule with density values of 42. Mild right hydronephrosis and hydroureter. Punctate 2 mm stone within the right posterior bladder. Bladder is otherwise unremarkable Stomach/Bowel: Stomach is within normal limits. Appendix appears normal. No evidence of bowel wall thickening, distention, or inflammatory changes. Vascular/Lymphatic: No significant vascular findings are present. No enlarged abdominal or  pelvic lymph nodes. Reproductive: Status post hysterectomy. No adnexal masses. Other: Negative for pelvic effusion or free air Musculoskeletal: No acute or significant osseous findings. Multilevel degenerative changes L3 through S1 IMPRESSION: 1. Mild right hydronephrosis and hydroureter. Punctate 2 mm stone within the right posterior bladder likely reflecting a recently passed kidney stone 2. Negative for acute appendicitis 3. Hepatic steatosis 4. 2.7 cm left adrenal mass. 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 This may be performed on a nonemergent basis Electronically Signed   By: Jasmine Pang M.D.   On: 12/29/2021 18:10    Procedures Procedures    Medications Ordered in ED Medications  oxyCODONE-acetaminophen (PERCOCET/ROXICET) 5-325 MG per tablet 1 tablet (1 tablet Oral Given 12/29/21 1532)  fentaNYL (SUBLIMAZE) injection 50 mcg (50  mcg Intravenous Given 12/29/21 1607)  ondansetron (ZOFRAN) injection 4 mg (4 mg Intravenous Given 12/29/21 1607)  iohexol (OMNIPAQUE) 350 MG/ML injection 75 mL (75 mLs Intravenous Contrast Given 12/29/21 1731)    ED Course/ Medical Decision Making/ A&P                           Medical Decision Making Amount and/or Complexity of Data Reviewed Labs: ordered. Decision-making details documented in ED Course. Radiology: ordered and independent interpretation performed. Decision-making details documented in ED Course.   Pt with symptoms consistent with kidney stone.  Denies infectious sx, or GI symptoms.  Low concern for diverticulitis and no risk factors or history suggestive of AAA.  No hx suggestive of GU source (discharge) and otherwise pt is healthy.  Will hydrate, treat pain and ensure no infection with UA, CBC, CMP and will get stone study to further eval.  I independently interpreted patient's labs and UA, CBC are within normal limits.  CMP with minimal elevated creatinine of 1.48 but no other  significant findings.  I have independently visualized and interpreted pt's images today.  CT shows evidence of hydronephrosis.  Radiology reports a recently passed 2 mm stone.  Patient also was noted to have a 2.7 cm left adrenal mass which they recommend nonemergent imaging for.  Findings were discussed with the patient and his wife who is at bedside.  Patient reports the pain is almost completely resolved at this time.  Feel that patient is stable for discharge home.  He will follow-up with the PCP for evaluation of the adrenal mass.          Final Clinical Impression(s) / ED Diagnoses Final diagnoses:  Kidney stone    Rx / DC Orders ED Discharge Orders     None         Gwyneth Sprout, MD 12/29/21 1851

## 2021-12-29 NOTE — Discharge Instructions (Addendum)
You had a kidney stone today and it looks like it has passed.  The severe pain should not return but you may get some cramping pain over your kidney due to the recently passed stone.  It is okay to take Tylenol or ibuprofen for this.  It is fine to return to work tomorrow.  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

## 2023-11-20 ENCOUNTER — Emergency Department (HOSPITAL_BASED_OUTPATIENT_CLINIC_OR_DEPARTMENT_OTHER)
Admission: EM | Admit: 2023-11-20 | Discharge: 2023-11-20 | Disposition: A | Discharge status: Home / Self Care | Payer: Self-pay | Attending: Emergency Medicine | Admitting: Emergency Medicine

## 2023-11-20 ENCOUNTER — Other Ambulatory Visit: Payer: Self-pay

## 2023-11-20 DIAGNOSIS — U071 COVID-19: Secondary | ICD-10-CM | POA: Insufficient documentation

## 2023-11-20 LAB — RESP PANEL BY RT-PCR (RSV, FLU A&B, COVID)  RVPGX2
Influenza A by PCR: NEGATIVE
Influenza B by PCR: NEGATIVE
Resp Syncytial Virus by PCR: NEGATIVE
SARS Coronavirus 2 by RT PCR: POSITIVE — AB

## 2023-11-20 MED ORDER — ONDANSETRON 4 MG PO TBDP
4.0000 mg | ORAL_TABLET | Freq: Once | ORAL | Status: AC
  Administered 2023-11-20: 4 mg via ORAL
  Filled 2023-11-20: qty 1

## 2023-11-20 MED ORDER — ONDANSETRON 4 MG PO TBDP: 4.0000 mg | ORAL_TABLET | Freq: Three times a day (TID) | ORAL | 0 refills | Status: AC | PRN

## 2023-11-20 NOTE — ED Provider Notes (Signed)
 [place] EMERGENCY DEPARTMENT AT [facility] Provider Note   CSN: [id number] Arrival date & time: 11/20/23  0030     Patient presents with: flu like symptoms   David Rowland is a 38 y.o. male.   The history is provided by the patient.  David Rowland is a 38 y.o. male who presents to the Emergency Department complaining of bodyaches. He presents the emergency department for evaluation of symptoms that started earlier today. He complains of body aches, nausea, pain with swallowing, cough. He gets hot and has sweats at times but no reported fevers. No abdominal pain, dysuria, vomiting, diarrhea, difficulty breathing. He has no known medical problems.     Prior to Admission medications   Medication Sig Start Date End Date Taking? Authorizing Provider  ondansetron  (ZOFRAN -ODT) 4 MG disintegrating tablet Take 1 tablet (4 mg total) by mouth every 8 (eight) hours as needed. 11/20/23  Yes Griselda Norris, MD  benzonatate  (TESSALON ) 100 MG capsule Take 1 capsule (100 mg total) by mouth 3 (three) times daily as needed for cough. 04/03/19   Petrucelli, Samantha R, PA-C  fluticasone  (FLONASE ) 50 MCG/ACT nasal spray Place 1 spray into both nostrils daily. 04/03/19   Petrucelli, Samantha R, PA-C  loperamide  (IMODIUM ) 2 MG capsule Take 1 capsule (2 mg total) by mouth as needed for diarrhea or loose stools. 09/25/19   Darr, Jacob, PA-C  naproxen  (NAPROSYN ) 500 MG tablet Take 1 tablet (500 mg total) by mouth 2 (two) times daily. 04/03/19   Petrucelli, Samantha R, PA-C  naproxen  sodium (ALEVE ) 220 MG tablet Take 220 mg by mouth.    [provider]  ondansetron  (ZOFRAN ) 4 MG tablet Take 1 tablet (4 mg total) by mouth every 8 (eight) hours as needed for nausea or vomiting. 09/25/19   Darr, Jacob, PA-C    Allergies: Patient has no known allergies.    Review of Systems  All other systems reviewed and are negative.   Updated Vital Signs BP (!) 124/91 (BP Location: Left Arm)   Pulse  71   Temp 98.1 F (36.7 C) (Oral)   Resp 18   Ht 6' 4 (1.93 m)   Wt 136.1 kg   SpO2 98%   BMI 36.52 kg/m   Physical Exam Vitals and nursing note reviewed.  Constitutional:      Appearance: He is well-developed.  HENT:     Head: Normocephalic and atraumatic.     Comments: No significant erythema or edema in posterior OP Cardiovascular:     Rate and Rhythm: Normal rate and regular rhythm.     Heart sounds: No murmur heard. Pulmonary:     Effort: Pulmonary effort is normal. No respiratory distress.     Breath sounds: Normal breath sounds.  Abdominal:     Palpations: Abdomen is soft.     Tenderness: There is no abdominal tenderness. There is no guarding or rebound.  Musculoskeletal:        General: No tenderness.  Skin:    General: Skin is warm and dry.  Neurological:     Mental Status: He is alert and oriented to person, place, and time.  Psychiatric:        Behavior: Behavior normal.     (all labs ordered are listed, but only abnormal results are displayed) Labs Reviewed  RESP PANEL BY RT-PCR (RSV, FLU A&B, COVID)  RVPGX2 - Abnormal; Notable for the following components:      Result Value   SARS Coronavirus 2 by RT PCR  POSITIVE (*)    All other components within normal limits    EKG: None  Radiology: No results found.   Procedures   Medications Ordered in the ED  ondansetron  (ZOFRAN -ODT) disintegrating tablet 4 mg (4 mg Oral Given 11/20/23 0155)                                    Medical Decision Making Risk Prescription drug management.   Patient here for evaluation bodyaches, nausea, cough and malaise. He is non-toxic appearing on evaluation of well hydrated. He is positive for COVID-19 infection. No evidence of secondary serious bacterial infection such as pneumonia, PTA, RPA, epiglotitis. Given patient's lack of comorbidities he is not a Paxlovid candidate. Discussed with patient home care for COVID-19 infection with oral fluid hydration, OTC  medications. Will prescribe ondansetron  that he may use as needed for nausea. Discussed return precautions.      Final diagnoses:  COVID-19 virus infection    ED Discharge Orders          Ordered    ondansetron  (ZOFRAN -ODT) 4 MG disintegrating tablet  Every 8 hours PRN        11/20/23 0149               Griselda Norris, MD 11/20/23 [phone]

## 2023-11-20 NOTE — ED Triage Notes (Signed)
 Headache, body aches and nausea started tonight.  Hasn't taken anything for his symptoms yet.

## 2024-03-20 ENCOUNTER — Emergency Department (HOSPITAL_BASED_OUTPATIENT_CLINIC_OR_DEPARTMENT_OTHER)
Admission: EM | Admit: 2024-03-20 | Discharge: 2024-03-20 | Disposition: A | Discharge status: Home / Self Care | Attending: Emergency Medicine | Admitting: Emergency Medicine

## 2024-03-20 ENCOUNTER — Other Ambulatory Visit: Payer: Self-pay

## 2024-03-20 ENCOUNTER — Encounter (HOSPITAL_BASED_OUTPATIENT_CLINIC_OR_DEPARTMENT_OTHER): Payer: Self-pay | Admitting: Emergency Medicine

## 2024-03-20 DIAGNOSIS — R197 Diarrhea, unspecified: Secondary | ICD-10-CM | POA: Diagnosis present

## 2024-03-20 NOTE — ED Provider Notes (Signed)
 " [name] Provider Note   CSN: [id number] Arrival date & time: 03/20/24  2303     Patient presents with: Diarrhea   David Rowland is a 39 y.o. male.   Patient is a 39 year old male with no significant past medical history.  Patient presenting with complaints of diarrhea and bodyaches.  Symptoms began earlier today.  He reports multiple episodes of diarrhea that is nonbloody.  No nausea or vomiting.  No fevers or chills.  He denies any significant abdominal pain.  He is able to tolerate liquid intake.  No ill contacts and denies having consumed any undercooked or suspicious foods.       Prior to Admission medications  Medication Sig Start Date End Date Taking? Authorizing Provider  benzonatate  (TESSALON ) 100 MG capsule Take 1 capsule (100 mg total) by mouth 3 (three) times daily as needed for cough. 04/03/19   Petrucelli, Samantha R, PA-C  fluticasone  (FLONASE ) 50 MCG/ACT nasal spray Place 1 spray into both nostrils daily. 04/03/19   Petrucelli, Samantha R, PA-C  loperamide  (IMODIUM ) 2 MG capsule Take 1 capsule (2 mg total) by mouth as needed for diarrhea or loose stools. 09/25/19   Darr, Jacob, PA-C  naproxen  (NAPROSYN ) 500 MG tablet Take 1 tablet (500 mg total) by mouth 2 (two) times daily. 04/03/19   Petrucelli, Samantha R, PA-C  naproxen  sodium (ALEVE ) 220 MG tablet Take 220 mg by mouth.    [provider]  ondansetron  (ZOFRAN ) 4 MG tablet Take 1 tablet (4 mg total) by mouth every 8 (eight) hours as needed for nausea or vomiting. 09/25/19   Darr, Jacob, PA-C  ondansetron  (ZOFRAN -ODT) 4 MG disintegrating tablet Take 1 tablet (4 mg total) by mouth every 8 (eight) hours as needed. 11/20/23   Griselda Norris, MD    Allergies: Patient has no known allergies.    Review of Systems  All other systems reviewed and are negative.   Updated Vital Signs BP (!) 141/93 (BP Location: Right Arm)   Pulse 85   Temp 98.6 F (37 C) (Oral)    Resp 20   Ht 6' 4 (1.93 m)   Wt 136.1 kg   SpO2 98%   BMI 36.52 kg/m   Physical Exam Vitals and nursing note reviewed.  Constitutional:      General: He is not in acute distress.    Appearance: He is well-developed. He is not diaphoretic.  HENT:     Head: Normocephalic and atraumatic.  Cardiovascular:     Rate and Rhythm: Normal rate and regular rhythm.     Heart sounds: No murmur heard.    No friction rub.  Pulmonary:     Effort: Pulmonary effort is normal. No respiratory distress.     Breath sounds: Normal breath sounds. No wheezing or rales.  Abdominal:     General: Bowel sounds are normal. There is no distension.     Palpations: Abdomen is soft.     Tenderness: There is no abdominal tenderness.  Musculoskeletal:        General: Normal range of motion.     Cervical back: Normal range of motion and neck supple.  Skin:    General: Skin is warm and dry.  Neurological:     Mental Status: He is alert and oriented to person, place, and time.     Coordination: Coordination normal.     (all labs ordered are listed, but only abnormal results are displayed) Labs Reviewed - No data  to display  EKG: None  Radiology: No results found.   Procedures   Medications Ordered in the ED - No data to display                                  Medical Decision Making  Patient presenting with diarrhea as described in the HPI.  He arrives here with stable vital signs and is afebrile.  Physical examination is unremarkable.  Abdomen is benign and he appears well-hydrated.  Patient declines IV fluids and laboratory studies.  He will be given a work note and is to follow-up as needed.  Symptoms likely viral in nature.     Final diagnoses:  None    ED Discharge Orders     None          Geroldine Berg, MD 03/20/24 2336  "

## 2024-03-20 NOTE — ED Triage Notes (Signed)
 Body aches and diarrhea since this morning. Was at a church functions. Tried Pepto at home.

## 2024-03-20 NOTE — Discharge Instructions (Signed)
 Drink plenty of fluids and get plenty of rest.  Take ibuprofen  600 mg every 6 hours as needed for aches and pains.  Return to the ER if you develop severe abdominal pain, high fevers, bloody stools, or for other new and concerning symptoms.
